# Patient Record
Sex: Female | Born: 1950 | Race: Black or African American | Hispanic: No | Smoking: Never smoker
Health system: Southern US, Community
[De-identification: ages and names within clinical notes are randomized; demographics above are authoritative.]

## PROBLEM LIST (undated history)

## (undated) DIAGNOSIS — I10 Essential (primary) hypertension: Secondary | ICD-10-CM

## (undated) DIAGNOSIS — G35 Multiple sclerosis: Secondary | ICD-10-CM

## (undated) DIAGNOSIS — N63 Unspecified lump in unspecified breast: Secondary | ICD-10-CM

## (undated) DIAGNOSIS — H539 Unspecified visual disturbance: Secondary | ICD-10-CM

## (undated) HISTORY — PX: KNEE SURGERY: SHX244

## (undated) HISTORY — DX: Unspecified visual disturbance: H53.9

## (undated) HISTORY — PX: PARTIAL HYSTERECTOMY: SHX80

---

## 1998-09-14 ENCOUNTER — Encounter: Payer: Self-pay | Admitting: Orthopedic Surgery

## 1998-09-15 ENCOUNTER — Ambulatory Visit (HOSPITAL_COMMUNITY): Admission: RE | Admit: 1998-09-15 | Discharge: 1998-09-15 | Payer: Self-pay | Admitting: Orthopedic Surgery

## 1998-10-04 ENCOUNTER — Encounter: Admission: RE | Admit: 1998-10-04 | Discharge: 1998-10-22 | Payer: Self-pay | Admitting: Orthopedic Surgery

## 1999-10-25 ENCOUNTER — Encounter: Admission: RE | Admit: 1999-10-25 | Discharge: 1999-11-23 | Payer: Self-pay | Admitting: Family Medicine

## 1999-11-17 ENCOUNTER — Encounter: Admission: RE | Admit: 1999-11-17 | Discharge: 1999-11-17 | Payer: Self-pay | Admitting: Family Medicine

## 1999-11-17 ENCOUNTER — Encounter: Payer: Self-pay | Admitting: Family Medicine

## 1999-11-28 ENCOUNTER — Encounter: Admission: RE | Admit: 1999-11-28 | Discharge: 1999-11-28 | Payer: Self-pay | Admitting: Family Medicine

## 1999-11-28 ENCOUNTER — Encounter: Payer: Self-pay | Admitting: Family Medicine

## 1999-12-13 ENCOUNTER — Other Ambulatory Visit: Admission: RE | Admit: 1999-12-13 | Discharge: 1999-12-13 | Payer: Self-pay | Admitting: Family Medicine

## 2000-03-22 ENCOUNTER — Ambulatory Visit (HOSPITAL_BASED_OUTPATIENT_CLINIC_OR_DEPARTMENT_OTHER): Admission: RE | Admit: 2000-03-22 | Discharge: 2000-03-22 | Payer: Self-pay | Admitting: Family Medicine

## 2000-06-28 ENCOUNTER — Encounter: Payer: Self-pay | Admitting: Family Medicine

## 2000-06-28 ENCOUNTER — Encounter: Admission: RE | Admit: 2000-06-28 | Discharge: 2000-06-28 | Payer: Self-pay | Admitting: Family Medicine

## 2001-01-22 ENCOUNTER — Encounter: Payer: Self-pay | Admitting: Family Medicine

## 2001-01-22 ENCOUNTER — Encounter: Admission: RE | Admit: 2001-01-22 | Discharge: 2001-01-22 | Payer: Self-pay | Admitting: Family Medicine

## 2001-02-28 ENCOUNTER — Encounter: Payer: Self-pay | Admitting: Family Medicine

## 2001-02-28 ENCOUNTER — Encounter: Admission: RE | Admit: 2001-02-28 | Discharge: 2001-02-28 | Payer: Self-pay | Admitting: Family Medicine

## 2001-03-02 ENCOUNTER — Inpatient Hospital Stay (HOSPITAL_COMMUNITY): Admission: EM | Admit: 2001-03-02 | Discharge: 2001-03-06 | Payer: Self-pay | Admitting: Emergency Medicine

## 2001-03-06 ENCOUNTER — Encounter: Payer: Self-pay | Admitting: Family Medicine

## 2001-03-13 ENCOUNTER — Encounter: Payer: Self-pay | Admitting: Family Medicine

## 2001-03-13 ENCOUNTER — Encounter: Admission: RE | Admit: 2001-03-13 | Discharge: 2001-03-13 | Payer: Self-pay | Admitting: Family Medicine

## 2001-05-10 ENCOUNTER — Encounter: Payer: Self-pay | Admitting: Neurology

## 2001-05-10 ENCOUNTER — Ambulatory Visit (HOSPITAL_COMMUNITY): Admission: RE | Admit: 2001-05-10 | Discharge: 2001-05-10 | Payer: Self-pay | Admitting: Neurology

## 2001-07-17 DIAGNOSIS — G35 Multiple sclerosis: Secondary | ICD-10-CM

## 2001-07-17 HISTORY — DX: Multiple sclerosis: G35

## 2003-02-18 ENCOUNTER — Encounter: Payer: Self-pay | Admitting: Family Medicine

## 2003-02-18 ENCOUNTER — Encounter: Admission: RE | Admit: 2003-02-18 | Discharge: 2003-02-18 | Payer: Self-pay | Admitting: Family Medicine

## 2007-06-06 ENCOUNTER — Encounter: Payer: Self-pay | Admitting: Obstetrics & Gynecology

## 2007-06-06 ENCOUNTER — Ambulatory Visit (HOSPITAL_COMMUNITY): Admission: RE | Admit: 2007-06-06 | Discharge: 2007-06-06 | Payer: Self-pay | Admitting: Family Medicine

## 2007-06-06 ENCOUNTER — Ambulatory Visit: Payer: Self-pay | Admitting: Obstetrics & Gynecology

## 2008-03-06 ENCOUNTER — Encounter: Admission: RE | Admit: 2008-03-06 | Discharge: 2008-06-04 | Payer: Self-pay | Admitting: Family Medicine

## 2010-08-26 ENCOUNTER — Other Ambulatory Visit: Payer: Self-pay | Admitting: Family Medicine

## 2010-08-26 DIAGNOSIS — Z1231 Encounter for screening mammogram for malignant neoplasm of breast: Secondary | ICD-10-CM

## 2010-09-01 ENCOUNTER — Ambulatory Visit
Admission: RE | Admit: 2010-09-01 | Discharge: 2010-09-01 | Disposition: A | Discharge status: Home / Self Care | Payer: 59 | Source: Ambulatory Visit | Attending: Family Medicine | Admitting: Family Medicine

## 2010-09-01 DIAGNOSIS — Z1231 Encounter for screening mammogram for malignant neoplasm of breast: Secondary | ICD-10-CM

## 2010-09-14 ENCOUNTER — Encounter: Payer: Self-pay | Admitting: Vascular Surgery

## 2010-09-28 ENCOUNTER — Encounter (INDEPENDENT_AMBULATORY_CARE_PROVIDER_SITE_OTHER): Payer: Medicare Other

## 2010-09-28 ENCOUNTER — Encounter (INDEPENDENT_AMBULATORY_CARE_PROVIDER_SITE_OTHER): Payer: Medicare Other | Admitting: Vascular Surgery

## 2010-09-28 ENCOUNTER — Encounter: Payer: Self-pay | Admitting: Vascular Surgery

## 2010-09-28 DIAGNOSIS — M7989 Other specified soft tissue disorders: Secondary | ICD-10-CM

## 2010-09-28 DIAGNOSIS — R609 Edema, unspecified: Secondary | ICD-10-CM
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
## 2010-10-05 NOTE — Procedures (Unsigned)
DUPLEX DEEP VENOUS EXAM - LOWER EXTREMITY  INDICATION:  Edema.  HISTORY:  Edema:  Yes. Trauma/Surgery:  No. Pain:  No. PE:  No. Previous DVT:  No. Anticoagulants:  No. Other:  DUPLEX EXAM:               CFV   SFV   PopV  PTV    GSV               R  L  R  L  R  L  R   L  R  L Thrombosis    o  o     o     o      o     o Spontaneous   +  +     +     +      +     + Phasic        +  +     +     +      +     + Augmentation  +  +     +     +      +     + Compressible  +  +     +     +      +     + Competent     +  +     +     +      +     +  Legend:  + - yes  o - no  p - partial  D - decreased   IMPRESSION: 1. No evidence of acute deep venous thrombosis or superficial     thrombophlebitis within the left lower extremity. 2. Edema visualized within the left lower extremity. 3. Pulsatile flow visualized within the left lower extremity,     suggestive of congestive heart failure.        _____________________________ Di Kindle. Edilia Bo, M.D.  OD/MEDQ  D:  09/28/2010  T:  09/28/2010  Job:  [id number]
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
## 2010-12-02 NOTE — Discharge Summary (Signed)
[facility]  Patient:    Danielle Mccarthy, Danielle Mccarthy Visit Number: [id number] MRN: [id number]          Service Type: MED Location: [phone] Attending Physician:  Beverely Low Dictated by:   Geraldo Pitter, M.D. Admit Date:  03/02/2001 Discharge Date: 03/06/2001                             Discharge Summary  HOSPITAL COURSE:  Patient is a 60 year old female who was admitted because of the inability to walk, question whether she had an upper neuron motor dysfunction.  She was seen by neurology and this was with Dr. Orlin Hilding who wanted to review her films initially and try to determine what her problem was.  She has had a progressive spastic quadriplegic paralysis and Dr. Orlin Hilding is unsure of the etiology.  Numerous tests were ordered which were all negative.  She did have a CAT scan done and an MRI done.  The MRI of the spinal cord showed no evidence of cord compression with a questionable small areas of altered signal intensity within the right aspect of the thoracic spinal cord at the T8 through 9 and T9 through 10 level.  Patient had physical therapy consultation and started walking with a walker in the hospital.  She slowly started to make improvement to the point that she could take a walker up a walk.  Initially, she was unable to go to the bathroom.  She was unable to do anything.  But, slowly, she is able to walk to the bathroom.  She does need help in coming back sometimes, but it is felt that she is certainly making improvement.  We will continue her on physical therapy on an outpatient basis.  She has had testing done and, at this point, we really do not know why she is having the problem, so we will discharge her home from the hospital in a somewhat improved state with the diagnosis of possible upper motor neuron dysfunction.  She will be sent home with physical therapy on an outpatient basis.  We will continue her Diovan, blood pressure  medications, and she will be seen in the office in a one to two week period of time. Dictated by:   Geraldo Pitter, M.D. Attending Physician:  Beverely Low DD:  04/11/01 TD:  04/11/01 Job: [id number] [id number]
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
## 2011-06-26 ENCOUNTER — Emergency Department (HOSPITAL_COMMUNITY): Payer: Medicare Other

## 2011-06-26 ENCOUNTER — Encounter (HOSPITAL_COMMUNITY): Payer: Self-pay | Admitting: Emergency Medicine

## 2011-06-26 ENCOUNTER — Other Ambulatory Visit: Payer: Self-pay

## 2011-06-26 ENCOUNTER — Inpatient Hospital Stay (HOSPITAL_COMMUNITY)
Admission: EM | Admit: 2011-06-26 | Discharge: 2011-07-01 | DRG: 602 | Disposition: A | Discharge status: Home / Self Care | Payer: Medicare Other | Attending: Family Medicine | Admitting: Family Medicine

## 2011-06-26 DIAGNOSIS — Z79899 Other long term (current) drug therapy: Secondary | ICD-10-CM

## 2011-06-26 DIAGNOSIS — M5030 Other cervical disc degeneration, unspecified cervical region: Secondary | ICD-10-CM | POA: Diagnosis present

## 2011-06-26 DIAGNOSIS — H9190 Unspecified hearing loss, unspecified ear: Secondary | ICD-10-CM | POA: Diagnosis present

## 2011-06-26 DIAGNOSIS — L03119 Cellulitis of unspecified part of limb: Principal | ICD-10-CM | POA: Diagnosis present

## 2011-06-26 DIAGNOSIS — J111 Influenza due to unidentified influenza virus with other respiratory manifestations: Secondary | ICD-10-CM | POA: Diagnosis present

## 2011-06-26 DIAGNOSIS — D72810 Lymphocytopenia: Secondary | ICD-10-CM

## 2011-06-26 DIAGNOSIS — L02419 Cutaneous abscess of limb, unspecified: Principal | ICD-10-CM | POA: Diagnosis present

## 2011-06-26 DIAGNOSIS — R609 Edema, unspecified: Secondary | ICD-10-CM | POA: Diagnosis present

## 2011-06-26 DIAGNOSIS — R600 Localized edema: Secondary | ICD-10-CM | POA: Diagnosis present

## 2011-06-26 DIAGNOSIS — J101 Influenza due to other identified influenza virus with other respiratory manifestations: Secondary | ICD-10-CM | POA: Diagnosis present

## 2011-06-26 DIAGNOSIS — I10 Essential (primary) hypertension: Secondary | ICD-10-CM | POA: Diagnosis present

## 2011-06-26 DIAGNOSIS — J189 Pneumonia, unspecified organism: Secondary | ICD-10-CM | POA: Diagnosis present

## 2011-06-26 DIAGNOSIS — R531 Weakness: Secondary | ICD-10-CM

## 2011-06-26 DIAGNOSIS — G35 Multiple sclerosis: Secondary | ICD-10-CM | POA: Diagnosis present

## 2011-06-26 DIAGNOSIS — R509 Fever, unspecified: Secondary | ICD-10-CM

## 2011-06-26 HISTORY — DX: Multiple sclerosis: G35

## 2011-06-26 HISTORY — DX: Essential (primary) hypertension: I10

## 2011-06-26 LAB — CBC
HCT: 45.2 % (ref 36.0–46.0)
MCV: 82 fL (ref 78.0–100.0)
RDW: 13.9 % (ref 11.5–15.5)
WBC: 4.2 10*3/uL (ref 4.0–10.5)

## 2011-06-26 LAB — DIFFERENTIAL
Basophils Absolute: 0 10*3/uL (ref 0.0–0.1)
Eosinophils Relative: 0 % (ref 0–5)
Lymphocytes Relative: 18 % (ref 12–46)
Lymphs Abs: 0.8 10*3/uL (ref 0.7–4.0)
Monocytes Absolute: 1.1 10*3/uL — ABNORMAL HIGH (ref 0.1–1.0)

## 2011-06-26 LAB — URINALYSIS, ROUTINE W REFLEX MICROSCOPIC
Bilirubin Urine: NEGATIVE
Glucose, UA: NEGATIVE mg/dL
Ketones, ur: NEGATIVE mg/dL
pH: 6.5 (ref 5.0–8.0)

## 2011-06-26 LAB — URINE MICROSCOPIC-ADD ON

## 2011-06-26 LAB — BASIC METABOLIC PANEL
CO2: 22 mEq/L (ref 19–32)
Chloride: 102 mEq/L (ref 96–112)
Creatinine, Ser: 0.68 mg/dL (ref 0.50–1.10)

## 2011-06-26 MED ORDER — SODIUM CHLORIDE 0.9 % IV SOLN
INTRAVENOUS | Status: DC
  Administered 2011-06-26: 1000 mL via INTRAVENOUS
  Administered 2011-06-27 – 2011-06-28 (×2): via INTRAVENOUS

## 2011-06-26 MED ORDER — ACETAMINOPHEN 325 MG PO TABS
650.0000 mg | ORAL_TABLET | Freq: Once | ORAL | Status: AC
  Administered 2011-06-26: 650 mg via ORAL
  Filled 2011-06-26: qty 2

## 2011-06-26 MED ORDER — ACETAMINOPHEN 325 MG PO TABS
ORAL_TABLET | ORAL | Status: AC
  Filled 2011-06-26: qty 1

## 2011-06-26 NOTE — ED Notes (Signed)
[id number]<AV> Expected date:<BR> Expected time:<BR> Means of arrival:<BR> Comments:<BR> Danielle Mccarthy possible pe, chest pain sob,

## 2011-06-26 NOTE — ED Notes (Signed)
Per EMS pt has had cold s/s with fever and cough x 3 weeks, pt has MS and has been seen, with no relief from medications.

## 2011-06-26 NOTE — ED Provider Notes (Signed)
History     CSN: [id number] Arrival date & time: 06/26/2011  5:37 PM   First MD Initiated Contact with Patient 06/26/11 1823      Chief Complaint  Patient presents with  . Cough  . Fever    (Consider location/radiation/quality/duration/timing/severity/associated sxs/prior treatment) Patient is a 60 y.o. female presenting with cough and fever. The history is provided by the patient.  Cough Associated symptoms include chills. Pertinent negatives include no chest pain, no headaches, no shortness of breath and no eye redness.  Fever Primary symptoms of the febrile illness include fever, cough and nausea. Primary symptoms do not include headaches, shortness of breath, abdominal pain, vomiting, diarrhea, dysuria or rash.   the patient is a 60 year old, female, with a mess and hypertension, who presents to the emergency department complaining of a cough since November 1 she says it improved and then became worse again, approximately a week ago.  She states she has yellow sputum.  She has had nausea, with no vomiting.  She says that her joints ache, as well.  However, she denies chest pain, or abdominal pain.  She has not had diarrhea.  She has had a fever and chills.  She denies diaphoresis.  She denies smoking.  COPD or history of CHF.  Past Medical History  Diagnosis Date  . Multiple sclerosis   . Hypertension     History reviewed. No pertinent past surgical history.  History reviewed. No pertinent family history.  History  Substance Use Topics  . Smoking status: Not on file  . Smokeless tobacco: Not on file  . Alcohol Use: No    OB History    Grav Para Term Preterm Abortions TAB SAB Ect Mult Living                  Review of Systems  Constitutional: Positive for fever and chills. Negative for diaphoresis.  HENT: Negative for congestion.   Eyes: Negative for redness.  Respiratory: Positive for cough. Negative for apnea and shortness of breath.   Cardiovascular: Negative  for chest pain.  Gastrointestinal: Positive for nausea. Negative for vomiting, abdominal pain and diarrhea.  Genitourinary: Negative for dysuria.  Skin: Negative for rash.  Neurological: Negative for headaches.  Psychiatric/Behavioral: Negative for confusion.  All other systems reviewed and are negative.    Allergies  Review of patient's allergies indicates no known allergies.  Home Medications   Current Outpatient Rx  Name Route Sig Dispense Refill  . BENZONATATE 100 MG PO CAPS Oral Take 200 mg by mouth 3 (three) times daily. Course started 05/29/11 for 10 day supply     . CARVEDILOL 25 MG PO TABS Oral Take 25 mg by mouth daily.      Marland Kitchen SIMVASTATIN 40 MG PO TABS Oral Take 40 mg by mouth at bedtime.      . SPIRONOLACTONE 25 MG PO TABS Oral Take 25 mg by mouth 2 (two) times daily.      Marland Kitchen TIZANIDINE HCL 4 MG PO TABS Oral Take 4 mg by mouth every 8 (eight) hours.        BP 148/87  Pulse 85  Temp(Src) 99.2 F (37.3 C) (Oral)  Resp 16  SpO2 100%  Physical Exam  Vitals reviewed. Constitutional: She is oriented to person, place, and time. She appears well-developed and well-nourished.  HENT:  Head: Normocephalic and atraumatic.  Eyes: Pupils are equal, round, and reactive to light.  Neck: Normal range of motion.  Cardiovascular: Normal rate, regular rhythm and  normal heart sounds.   No murmur heard. Pulmonary/Chest: Effort normal and breath sounds normal. No respiratory distress. She has no wheezes. She has no rales.  Abdominal: Soft. She exhibits no distension and no mass. There is no tenderness. There is no rebound and no guarding.  Musculoskeletal: She exhibits no edema and no tenderness.  Neurological: She is alert and oriented to person, place, and time. No cranial nerve deficit.  Skin: Skin is warm and dry. No rash noted. No erythema.  Psychiatric: She has a normal mood and affect. Her behavior is normal.    ED Course  Procedures (including critical care  time) 60 year old female complains of fever, and cough for several weeks.  She is not hypoxic, and she is in no distress.  We will perform a chest x-ray, and laboratory testing, looking for pneumonia and treat according to findings.  Labs Reviewed - No data to display No results found.   10:31 PM Her caretaker has arrived and says now.  The patient is too weak to stand usually she is able to walk with a walker, but now.  She is unable to.  She is not taking any steroids for her multiple sclerosis.  I will consult neurology, as well as medicine for admission.   11:52 PM I spoke with Dr. Pearlean Brownie.  He said that he or his colleagues will consult on her tomorrow.  He did not want me to start steroids.  I spoke with Dr. Lavera Guise.  He agreed to admit the patient to the hospital for an MS exacerbation.  MDM  Multiple sclerosis Weakness Fever        Nicholes Stairs, MD 06/26/11 2352

## 2011-06-26 NOTE — ED Notes (Signed)
Pt's son reports that pt has advanced MS, developed a cough three weeks ago, was prescribed cough medicine which helped until yesterday. Son reports that pt started having a runny nose, drainage, coughing, and a fever. Pt's oral temp 99.31F in triage. Pt alert x 3, talks very softly. Reports that she has had MS since 2002.

## 2011-06-26 NOTE — ED Notes (Signed)
hospitalist at the bedside 

## 2011-06-26 NOTE — ED Notes (Signed)
Pt.EKG was done, it was normal EKG

## 2011-06-26 NOTE — ED Notes (Signed)
MD at bedside. 

## 2011-06-27 ENCOUNTER — Emergency Department (HOSPITAL_COMMUNITY): Payer: Medicare Other

## 2011-06-27 ENCOUNTER — Encounter (HOSPITAL_COMMUNITY): Payer: Self-pay | Admitting: Internal Medicine

## 2011-06-27 DIAGNOSIS — L02419 Cutaneous abscess of limb, unspecified: Secondary | ICD-10-CM | POA: Diagnosis present

## 2011-06-27 DIAGNOSIS — R600 Localized edema: Secondary | ICD-10-CM | POA: Diagnosis present

## 2011-06-27 LAB — BASIC METABOLIC PANEL
CO2: 25 mEq/L (ref 19–32)
Chloride: 110 mEq/L (ref 96–112)
Creatinine, Ser: 0.69 mg/dL (ref 0.50–1.10)

## 2011-06-27 LAB — CBC
HCT: 36.2 % (ref 36.0–46.0)
Hemoglobin: 11.6 g/dL — ABNORMAL LOW (ref 12.0–15.0)
MCV: 80.8 fL (ref 78.0–100.0)
RDW: 14 % (ref 11.5–15.5)
WBC: 2.2 10*3/uL — ABNORMAL LOW (ref 4.0–10.5)

## 2011-06-27 MED ORDER — ALUM & MAG HYDROXIDE-SIMETH 200-200-20 MG/5ML PO SUSP: 30.0000 mL | Freq: Four times a day (QID) | ORAL | Status: DC | PRN

## 2011-06-27 MED ORDER — CARVEDILOL 25 MG PO TABS
25.0000 mg | ORAL_TABLET | Freq: Every day | ORAL | Status: DC
  Administered 2011-06-27 – 2011-06-30 (×4): 25 mg via ORAL
  Filled 2011-06-27 (×4): qty 1

## 2011-06-27 MED ORDER — SENNOSIDES-DOCUSATE SODIUM 8.6-50 MG PO TABS
1.0000 | ORAL_TABLET | Freq: Every evening | ORAL | Status: DC | PRN
  Filled 2011-06-27: qty 1

## 2011-06-27 MED ORDER — ACETAMINOPHEN 325 MG PO TABS: 650.0000 mg | ORAL_TABLET | Freq: Four times a day (QID) | ORAL | Status: DC | PRN

## 2011-06-27 MED ORDER — ONDANSETRON HCL 4 MG PO TABS: 4.0000 mg | ORAL_TABLET | Freq: Four times a day (QID) | ORAL | Status: DC | PRN

## 2011-06-27 MED ORDER — GADOBENATE DIMEGLUMINE 529 MG/ML IV SOLN
20.0000 mL | Freq: Once | INTRAVENOUS | Status: AC | PRN
  Administered 2011-06-27: 17 mL via INTRAVENOUS

## 2011-06-27 MED ORDER — SIMVASTATIN 40 MG PO TABS
40.0000 mg | ORAL_TABLET | Freq: Every day | ORAL | Status: DC
  Administered 2011-06-27 – 2011-06-30 (×4): 40 mg via ORAL
  Filled 2011-06-27 (×6): qty 1

## 2011-06-27 MED ORDER — TIZANIDINE HCL 4 MG PO TABS
4.0000 mg | ORAL_TABLET | Freq: Four times a day (QID) | ORAL | Status: DC | PRN
  Filled 2011-06-27: qty 1

## 2011-06-27 MED ORDER — MOXIFLOXACIN HCL IN NACL 400 MG/250ML IV SOLN
400.0000 mg | INTRAVENOUS | Status: DC
  Administered 2011-06-26 – 2011-06-28 (×2): 400 mg via INTRAVENOUS
  Filled 2011-06-27 (×2): qty 250

## 2011-06-27 MED ORDER — ALBUTEROL SULFATE (5 MG/ML) 0.5% IN NEBU: 2.5000 mg | INHALATION_SOLUTION | RESPIRATORY_TRACT | Status: DC | PRN

## 2011-06-27 MED ORDER — ENOXAPARIN SODIUM 40 MG/0.4ML ~~LOC~~ SOLN
40.0000 mg | Freq: Every day | SUBCUTANEOUS | Status: DC
  Administered 2011-06-27 – 2011-06-30 (×5): 40 mg via SUBCUTANEOUS
  Filled 2011-06-27 (×7): qty 0.4

## 2011-06-27 MED ORDER — OSELTAMIVIR PHOSPHATE 75 MG PO CAPS
75.0000 mg | ORAL_CAPSULE | Freq: Two times a day (BID) | ORAL | Status: DC
  Administered 2011-06-27 – 2011-07-01 (×8): 75 mg via ORAL
  Filled 2011-06-27 (×12): qty 1

## 2011-06-27 MED ORDER — VANCOMYCIN HCL IN DEXTROSE 1-5 GM/200ML-% IV SOLN
1000.0000 mg | Freq: Two times a day (BID) | INTRAVENOUS | Status: DC
  Administered 2011-06-27 – 2011-06-28 (×3): 1000 mg via INTRAVENOUS
  Filled 2011-06-27 (×8): qty 200

## 2011-06-27 MED ORDER — ACETAMINOPHEN 650 MG RE SUPP: 650.0000 mg | Freq: Four times a day (QID) | RECTAL | Status: DC | PRN

## 2011-06-27 MED ORDER — INFLUENZA VIRUS VACC SPLIT PF IM SUSP: 0.5000 mL | INTRAMUSCULAR | Status: DC | PRN

## 2011-06-27 MED ORDER — ONDANSETRON HCL 4 MG/2ML IJ SOLN: 4.0000 mg | Freq: Four times a day (QID) | INTRAMUSCULAR | Status: DC | PRN

## 2011-06-27 MED ORDER — GUAIFENESIN ER 600 MG PO TB12
600.0000 mg | ORAL_TABLET | Freq: Two times a day (BID) | ORAL | Status: DC
  Administered 2011-06-27 – 2011-07-01 (×9): 600 mg via ORAL
  Filled 2011-06-27 (×12): qty 1

## 2011-06-27 NOTE — ED Notes (Signed)
Patient transported to MRI 

## 2011-06-27 NOTE — ED Notes (Signed)
Pt will be in MRI for an hour and a half.

## 2011-06-27 NOTE — ED Notes (Signed)
Unable to draw AM labs, lab notified for the need for a phlebotomist to draw labs.
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
## 2011-06-27 NOTE — ED Notes (Signed)
Assisted pt eating her breakfast tray.  Swallows well, but has very limited use of left hand/arm

## 2011-06-27 NOTE — Progress Notes (Signed)
ANTIBIOTIC CONSULT NOTE - INITIAL  Pharmacy Consult for Vancomycin Indication: LLE  Cellulits/abscess/Atypical PNA  No Known Allergies  Patient Measurements: Height: 5\' 2"  (157.5 cm) Weight: 180 lb (81.647 kg) IBW/kg (Calculated) : 50.1   Vital Signs: Temp: 98.8 F (37.1 C) (12/11 0042) Temp src: Oral (12/11 0042) BP: 122/70 mmHg (12/11 0042) Pulse Rate: 78  (12/11 0042) Intake/Output from previous day:   Intake/Output from this shift:    Labs:  [facility] 06/26/11 1956  WBC 4.2  HGB 14.6  PLT 169  LABCREA --  CREATININE 0.68   Estimated Creatinine Clearance: 73.1 ml/min (by C-G formula based on Cr of 0.68). No results found for this basename: VANCOTROUGH:2,VANCOPEAK:2,VANCORANDOM:2,GENTTROUGH:2,GENTPEAK:2,GENTRANDOM:2,TOBRATROUGH:2,TOBRAPEAK:2,TOBRARND:2,AMIKACINPEAK:2,AMIKACINTROU:2,AMIKACIN:2, in the last 72 hours   Microbiology: No results found for this or any previous visit (from the past 720 hour(s)).  Medical History: Past Medical History  Diagnosis Date  . Multiple sclerosis   . Hypertension     Medications:  Scheduled:    . acetaminophen      . acetaminophen  650 mg Oral Once  . enoxaparin  40 mg Subcutaneous QHS  . moxifloxacin  400 mg Intravenous Q24H  . simvastatin  40 mg Oral QHS   Infusions:    . sodium chloride 1,000 mL (06/26/11 1934)   Assessment: 60 yo female with history of MS admitted with cellulits of LLE/abscess and possible atypical PNA.  Goal of Therapy:  Vancomycin trough level 15-20 mcg/ml  Plan:  Vancomycin 1Gm IV q12h. F/U SCr/levels as needed.  Danielle Mccarthy R 06/27/2011,1:04 AM

## 2011-06-27 NOTE — Progress Notes (Addendum)
Subjective:  Feels better overnight. Cough is less, no shortness of breath or chest pain. Objective: Vital signs in last 24 hours: Temp:  [98.8 F (37.1 C)-101 F (38.3 C)] 98.8 F (37.1 C) (12/11 0344) Pulse Rate:  [78-85] 78  (12/11 0344) Resp:  [16-27] 21  (12/11 0344) BP: (122-152)/(70-87) 152/81 mmHg (12/11 0344) SpO2:  [100 %] 100 % (12/11 0344) Weight:  [81.647 kg (180 lb)] 180 lb (81.647 kg) (12/11 0100) Weight change:     Intake/Output from previous day:       Physical Exam: General: Comfortable, alert, communicative, fully oriented, not short of breath at rest, very mildly dysarthric. HEENT:  No clinical pallor, no jaundice, no conjunctival injection or discharge. Buccal mucosa seems "dry". NECK:  Supple, JVP not seen, no carotid bruits, no palpable lymphadenopathy, no palpable goiter. CHEST:  Clinically clear to auscultation, no wheezes, no crackles. HEART:  Sounds 1 and 2 heard, normal, regular, no murmurs. ABDOMEN:  Full, soft, non-tender, no palpable organomegaly, no palpable masses, normal bowel sounds. GENITALIA:  Not examined. LOWER EXTREMITIES:  No pitting edema, although skins is a bit wrinkled, suggestive of resolved recent swelling. There is an 6cm x 8cm erythematous area, anterior to left lower leg, above the ankle, without fluctuance. Palpable peripheral pulses. MUSCULOSKELETAL SYSTEM:  Generalized osteoarthritic changes, otherwise, normal. CENTRAL NERVOUS SYSTEM:  No focal neurologic deficit on gross examination. Has RUE monoparesis.  Lab Results:  [facility] 06/27/11 0625 06/26/11 1956  WBC 2.2* 4.2  HGB 11.6* 14.6  HCT 36.2 45.2  PLT 173 169    Basename 06/27/11 0625 06/26/11 1956  NA 143 137  K 3.3* 3.9  CL 110 102  CO2 25 22  GLUCOSE 87 84  BUN 5* 7  CREATININE 0.69 0.68  CALCIUM 8.1* 9.0   No results found for this or any previous visit (from the past 240 hour(s)).   Studies/Results: Dg Pneumonia Chest 1v  06/26/2011  *RADIOLOGY  REPORT*  Clinical Data: Shortness of breath, question pneumonia  CHEST - 1 VIEW  Comparison: Portable exam 1940 hours without priors for comparison.  Findings: Slight rotation to the left. Upper normal heart size. Normal mediastinal contours and pulmonary vascularity. Azygos fissure noted. No gross infiltrate or effusion. No pneumothorax. Bones unremarkable.  IMPRESSION: No definite acute abnormalities.  Original Report Authenticated By: Lollie Marrow, M.D.    Medications: Scheduled Meds:   . acetaminophen      . acetaminophen  650 mg Oral Once  . enoxaparin  40 mg Subcutaneous QHS  . moxifloxacin  400 mg Intravenous Q24H  . simvastatin  40 mg Oral QHS  . vancomycin  1,000 mg Intravenous Q12H   Continuous Infusions:   . sodium chloride 1,000 mL (06/26/11 1934)   PRN Meds:.acetaminophen, acetaminophen, albuterol, alum & mag hydroxide-simeth, ondansetron (ZOFRAN) IV, ondansetron, senna-docusate, tiZANidine  Assessment/Plan:  Principal Problem:  *Cellulitis and abscess of leg: patient is on day# 2 Vancomycin, and already has clinical improvement. Will continue for now. For completeness, we shall do blood cultures. Active Problems:  1. Multiple sclerosis: This appears stable. Patient has RUE weakness of some standing, usually is able to feed herself with Lt arm. Ambulant with walker. For PT/OT.  2. Hypertension: Sub-optimally controlled. We shall restart pre-admission Coreg.  3. Acute bronchitis: Evidenced by productive cough with yellow phlegm,pyrexia and clear CXR. I concur with Avelox, now da# 2. Will add Mucinex to Rx. Due to epidemiologic considerations, will do Flu test.  4. Edema of lower extremity: Only minimal,  at this time.  Comment: Will continue maintenance ivi fluids, for now, at 75 cc/hr.  LOS: 1 day   Lavanna Rog,CHRISTOPHER 06/27/2011, 8:25 AM  Addendum 6:11 PM  Influenza PCR is positive for Influenza A. Have placed on droplet isolation and started 5 day course of Tamiflu.

## 2011-06-27 NOTE — Consult Note (Signed)
Reason for Consult: weakness   CC: weakness and possible MS exacerbation  HPI: Danielle Mccarthy is an 60 y.o. female  has a past medical history of Multiple sclerosis and Hypertension. Patient is followed at [facility] for her MS. Her son who is at bedside stated she was on Betaseron and most recently Rebif  for 8 years however due to progression of MS and minimal benefits from medication she was taken off.  At present time she is only on muscle relaxer (tazanadine).  Over the past few weeks she has noted increased cough and flu like symptoms.  At base line she has difficulty raising her arms greater than 45 degrees and she walks with a walker.  Due to notable weakness and flu symptoms she was brought to the ED.  At current time she has a nonproductive cough, nonfebrile, WBC 2.2, K+  3.3, Ca+ 8.1.  Past Medical History  Diagnosis Date  . Multiple sclerosis   . Hypertension     History reviewed. No pertinent past surgical history.  Family History  Problem Relation Age of Onset  . Hypertension Brother     Social History:  reports that she has never smoked. She has never used smokeless tobacco. She reports that she does not drink alcohol or use illicit drugs.  No Known Allergies  Medications:  Prior to Admission:  (Not in a hospital admission) Scheduled:   . acetaminophen      . acetaminophen  650 mg Oral Once  . carvedilol  25 mg Oral Daily  . enoxaparin  40 mg Subcutaneous QHS  . guaiFENesin  600 mg Oral BID  . moxifloxacin  400 mg Intravenous Q24H  . simvastatin  40 mg Oral QHS  . vancomycin  1,000 mg Intravenous Q12H    Review of Systems - General ROS: negative for - chills, fatigue, fever or hot flashes Hematological and Lymphatic ROS: negative for - bruising, fatigue, jaundice or pallor Endocrine ROS: negative for - hair pattern changes, hot flashes, mood swings or skin changes Respiratory ROS: negative for - cough, hemoptysis, orthopnea or wheezing Cardiovascular ROS:  negative for - dyspnea on exertion, orthopnea, palpitations or shortness of breath Gastrointestinal ROS: negative for - abdominal pain, appetite loss, blood in stools, diarrhea or hematemesis Musculoskeletal ROS: negative for - joint pain, joint stiffness, joint swelling or muscle pain Neurological ROS: as noted above in HPI Dermatological ROS: negative for dry skin, pruritus and rash   Blood pressure 161/73, pulse 77, temperature 98.8 F (37.1 C), temperature source Oral, resp. rate 22, height 5\' 2"  (1.575 m), weight 81.647 kg (180 lb), SpO2 100.00%.   Neurologic Examination: Mental Status: Alert, oriented, thought content appropriate.  Speech fluent without evidence of aphasia. Able to follow 3 step commands without difficulty. Cranial Nerves: II- Visual fields grossly intact. III/IV/VI-Extraocular movements intact.  Pupils reactive bilaterally. V/VII-Smile symmetric VIII-grossly intact IX/X-normal gag XI-bilateral shoulder shrug XII-midline tongue extension Motor: she is able to FF her arms about 30 degrees but unable to abduct her arms.  Both arms are held in flexion with increased tone.  I am able to break the tone and extend her out fully.  She shows weaker right arm than left (baseline).  Fingers are slightly flexed and shows stronger grip on the left.  She is unable to lift her legs off the bed or bend her knees. She slightly DF and PF her feet again L>R.  She has significant tone bilateral legs and I have a difficult time breaking the tone.  Sensory: Pinprick and light touch intact throughout, bilaterally Deep Tendon Reflexes: 2+ and symmetric throughout UE and 1+ bil. Patella and no ankle.   Plantars: up going bilaterally Cerebellar: Normal finger-to-nose, normal rapid alternating movements and normal heel-to-shin test.  Normal gait and station.     No results found for this basename: cbc, bmp, coags, chol, tri, ldl, hga1c    Results for orders placed during the hospital  encounter of 06/26/11 (from the past 48 hour(s))  BASIC METABOLIC PANEL     Status: Normal   Collection Time   06/26/11  7:56 PM      Component Value Range Comment   Sodium 137  135 - 145 (mEq/L)    Potassium 3.9  3.5 - 5.1 (mEq/L)    Chloride 102  96 - 112 (mEq/L)    CO2 22  19 - 32 (mEq/L)    Glucose, Bld 84  70 - 99 (mg/dL)    BUN 7  6 - 23 (mg/dL)    Creatinine, Ser 9.56  0.50 - 1.10 (mg/dL)    Calcium 9.0  8.4 - 10.5 (mg/dL)    GFR calc non Af Amer >90  >90 (mL/min)    GFR calc Af Amer >90  >90 (mL/min)   CBC     Status: Abnormal   Collection Time   06/26/11  7:56 PM      Component Value Range Comment   WBC 4.2  4.0 - 10.5 (K/uL)    RBC 5.51 (*) 3.87 - 5.11 (MIL/uL)    Hemoglobin 14.6  12.0 - 15.0 (g/dL)    HCT 21.3  08.6 - 57.8 (%)    MCV 82.0  78.0 - 100.0 (fL)    MCH 26.5  26.0 - 34.0 (pg)    MCHC 32.3  30.0 - 36.0 (g/dL)    RDW 46.9  62.9 - 52.8 (%)    Platelets 169  150 - 400 (K/uL)   DIFFERENTIAL     Status: Abnormal   Collection Time   06/26/11  7:56 PM      Component Value Range Comment   Neutrophils Relative 56  43 - 77 (%)    Neutro Abs 2.4  1.7 - 7.7 (K/uL)    Lymphocytes Relative 18  12 - 46 (%)    Lymphs Abs 0.8  0.7 - 4.0 (K/uL)    Monocytes Relative 26 (*) 3 - 12 (%)    Monocytes Absolute 1.1 (*) 0.1 - 1.0 (K/uL)    Eosinophils Relative 0  0 - 5 (%)    Eosinophils Absolute 0.0  0.0 - 0.7 (K/uL)    Basophils Relative 1  0 - 1 (%)    Basophils Absolute 0.0  0.0 - 0.1 (K/uL)   URINALYSIS, ROUTINE W REFLEX MICROSCOPIC     Status: Abnormal   Collection Time   06/26/11  9:45 PM      Component Value Range Comment   Color, Urine YELLOW  YELLOW     APPearance CLEAR  CLEAR     Specific Gravity, Urine 1.007  1.005 - 1.030     pH 6.5  5.0 - 8.0     Glucose, UA NEGATIVE  NEGATIVE (mg/dL)    Hgb urine dipstick LARGE (*) NEGATIVE     Bilirubin Urine NEGATIVE  NEGATIVE     Ketones, ur NEGATIVE  NEGATIVE (mg/dL)    Protein, ur NEGATIVE  NEGATIVE (mg/dL)     Urobilinogen, UA 0.2  0.0 - 1.0 (mg/dL)    Nitrite  NEGATIVE  NEGATIVE     Leukocytes, UA NEGATIVE  NEGATIVE    URINE MICROSCOPIC-ADD ON     Status: Normal   Collection Time   06/26/11  9:45 PM      Component Value Range Comment   Squamous Epithelial / LPF RARE  RARE     WBC, UA 0-2  <3 (WBC/hpf)    RBC / HPF 3-6  <3 (RBC/hpf)    Bacteria, UA RARE  RARE    BASIC METABOLIC PANEL     Status: Abnormal   Collection Time   06/27/11  6:25 AM      Component Value Range Comment   Sodium 143  135 - 145 (mEq/L)    Potassium 3.3 (*) 3.5 - 5.1 (mEq/L)    Chloride 110  96 - 112 (mEq/L)    CO2 25  19 - 32 (mEq/L)    Glucose, Bld 87  70 - 99 (mg/dL)    BUN 5 (*) 6 - 23 (mg/dL)    Creatinine, Ser 4.09  0.50 - 1.10 (mg/dL)    Calcium 8.1 (*) 8.4 - 10.5 (mg/dL)    GFR calc non Af Amer >90  >90 (mL/min)    GFR calc Af Amer >90  >90 (mL/min)   CBC     Status: Abnormal   Collection Time   06/27/11  6:25 AM      Component Value Range Comment   WBC 2.2 (*) 4.0 - 10.5 (K/uL)    RBC 4.48  3.87 - 5.11 (MIL/uL)    Hemoglobin 11.6 (*) 12.0 - 15.0 (g/dL)    HCT 81.1  91.4 - 78.2 (%)    MCV 80.8  78.0 - 100.0 (fL)    MCH 25.9 (*) 26.0 - 34.0 (pg)    MCHC 32.0  30.0 - 36.0 (g/dL)    RDW 95.6  21.3 - 08.6 (%)    Platelets 173  150 - 400 (K/uL)     Dg Pneumonia Chest 1v  06/26/2011  *RADIOLOGY REPORT*  Clinical Data: Shortness of breath, question pneumonia  CHEST - 1 VIEW  Comparison: Portable exam 1940 hours without priors for comparison.  Findings: Slight rotation to the left. Upper normal heart size. Normal mediastinal contours and pulmonary vascularity. Azygos fissure noted. No gross infiltrate or effusion. No pneumothorax. Bones unremarkable.  IMPRESSION: No definite acute abnormalities.  Original Report Authenticated By: Lollie Marrow, M.D.     Assessment/Plan: 60 YO female with known MS who is seen at [facility].  She is on no current medications for her MS except muscle relaxants.  Over past 2  weeks she has had increased weakness and flu like sx.  Ddx includes viral illness, PNA or possibly MS flair.   Recommend: (1) due to WBC 2.2 and uncertain this is MS flair would not recommend steroid at this time (2) MRI brain with contrast (3) will follow   Felicie Morn PA-C [facility] [phone]  06/27/2011, 9:20 AM

## 2011-06-27 NOTE — ED Notes (Signed)
Assisted pt by feeding her lunch tray.  Pt tolerated well.  Call bell in reach. Pt denies needing anything at this time.

## 2011-06-27 NOTE — ED Notes (Signed)
Pt back from MRI  Pt alert and oriented x4. Respirations even and unlabored, bilateral symmetrical rise and fall of chest. Skin warm and dry. In no acute distress. Denies needs.   

## 2011-06-27 NOTE — ED Notes (Signed)
Lab tech at bedside for a.m labs. Resting comfortably

## 2011-06-27 NOTE — ED Notes (Signed)
Lab came to draw blood cultures but pt had just left for MRI.

## 2011-06-27 NOTE — Progress Notes (Signed)
Report rec'd from [employer] @ [phone].  Pt coming to rm [id number].
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
## 2011-06-28 DIAGNOSIS — J101 Influenza due to other identified influenza virus with other respiratory manifestations: Secondary | ICD-10-CM | POA: Diagnosis present

## 2011-06-28 LAB — CBC
HCT: 36.4 % (ref 36.0–46.0)
Hemoglobin: 11.5 g/dL — ABNORMAL LOW (ref 12.0–15.0)
MCV: 80.7 fL (ref 78.0–100.0)
RBC: 4.51 MIL/uL (ref 3.87–5.11)
WBC: 2.6 10*3/uL — ABNORMAL LOW (ref 4.0–10.5)

## 2011-06-28 LAB — BASIC METABOLIC PANEL
BUN: 8 mg/dL (ref 6–23)
CO2: 24 mEq/L (ref 19–32)
Chloride: 110 mEq/L (ref 96–112)
Creatinine, Ser: 0.63 mg/dL (ref 0.50–1.10)
Glucose, Bld: 103 mg/dL — ABNORMAL HIGH (ref 70–99)

## 2011-06-28 NOTE — Progress Notes (Addendum)
Occupational Therapy Evaluation Patient Details Name: Danielle Mccarthy MRN: [id number] DOB: 1950/10/21 Today's Date: 06/28/2011 Time in: 10:45 am Time out: 11:10 am Eval II  Problem List:  Patient Active Problem List  Diagnoses  . Multiple sclerosis  . Hypertension  . Fever  . Edema of lower extremity  . Cellulitis and abscess of leg  . Influenza A    Past Medical History:  Past Medical History  Diagnosis Date  . Multiple sclerosis   . Hypertension    Past Surgical History: History reviewed. No pertinent past surgical history.  OT Assessment/Plan/Recommendation OT Assessment Clinical Impression Statement: patient will benefit from OT services to return to prior level of function.  OT Recommendation/Assessment: Patient will need skilled OT in the acute care venue OT Problem List: Decreased strength;Decreased range of motion;Impaired tone OT Therapy Diagnosis : Generalized weakness;Hemiplegia dominant side;Paresis OT Plan OT Frequency: Min 1X/week OT Treatment/Interventions: Self-care/ADL training;Therapeutic activities;DME and/or AE instruction OT Recommendation Follow Up Recommendations: None Equipment Recommended: None recommended by OT Individuals Consulted Consulted and Agree with Results and Recommendations: Patient OT Goals Acute Rehab OT Goals OT Goal Formulation: With patient Time For Goal Achievement: 2 weeks ADL Goals Pt Will Perform Eating: with min assist;with adaptive utensils ADL Goal: Eating - Progress: Progressing toward goals Pt Will Transfer to Toilet: with mod assist;Stand pivot transfer;3-in-1;with DME ADL Goal: Toilet Transfer - Progress: Progressing toward goals  OT Evaluation Precautions/Restrictions  Precautions Precautions: Fall Precaution Comments: droplet Restrictions Weight Bearing Restrictions: No Prior Functioning Home Living Lives With: Sheran Spine Help From: Family Type of Home: House Home Layout: One level Home  Access: Ramped entrance Bathroom Shower/Tub: Walk-in shower Bathroom Accessibility: Yes How Accessible: Accessible via wheelchair Home Adaptive Equipment: Walker - rolling;Wheelchair - manual;Shower chair with back;Bedside commode/3-in-1 Prior Function Level of Independence: Needs assistance with ADLs;Needs assistance with tranfers Bath: Total Toileting: Total Dressing: Total Grooming: Maximal Feeding: Supervision/set-up ADL ADL Eating/Feeding: +1 Total assistance;Other (comment) Eating/Feeding Details (indicate cue type and reason): PTA, pt rested elbow on table and able to use elbow movement and compensate with head movement, to feed herself. With position of patient in bed and bed table further away, pt unable to reach food to scoop, etc.  Where Assessed - Eating/Feeding: Bed level Grooming: Performed;Teeth care;Wash/dry face;+1 Total assistance Grooming Details (indicate cue type and reason): pt required assist for application of toothpaste and assist to position washcloth and toothhbrush in her left hand. Pt able to use some elbow motion and head turns to brush teeth and wash face but still needed assist to reach all areas. Where Assessed - Grooming: Supine, head of bed up Upper Body Bathing: Simulated;+1 Total assistance Where Assessed - Upper Body Bathing: Supine, head of bed up Lower Body Bathing: +1 Total assistance;Simulated Lower Body Bathing Details (indicate cue type and reason): for legs. did not have patient roll for periareas simulated as she had just returned to bed with PT Where Assessed - Lower Body Bathing: Supine, head of bed up Upper Body Dressing: Simulated;+1 Total assistance Where Assessed - Upper Body Dressing: Supine, head of bed up Lower Body Dressing: Simulated;+1 Total assistance Where Assessed - Lower Body Dressing: Supine, head of bed up Toilet Transfer: Not assessed (Per PT was total assist for transfer) Toilet Transfer Method: Not assessed Toileting -  Clothing Manipulation: Not assessed Where Assessed - Toileting Clothing Manipulation: Not assessed Toileting - Hygiene: Not assessed Where Assessed - Toileting Hygiene: Not assessed Tub/Shower Transfer: Not assessed Tub/Shower Transfer Method: Not assessed ADL Comments: 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 Vision/Perception  Vision - History Baseline Vision: Wears glasses all the time Cognition Cognition Arousal/Alertness: Awake/alert Overall Cognitive Status: Appears within functional limits for tasks assessed Orientation Level: Oriented X4 Cognition - Other Comments: patient hard of hearing (even with hearing aids needs to read lips) Sensation/Coordination Sensation Light Touch: Appears Intact Extremity Assessment RUE Assessment RUE Assessment: Exceptions to [facility] RUE Strength RUE Overall Strength Comments: fingers flexed. Unable to straighten. not able to raise UE off bed. Minimal movement at wrist.  (noted tone) LUE Assessment LUE Assessment: Exceptions to [facility] LUE Strength LUE Overall Strength Comments: able to achieve approximately 30 degrees of shoulder flexion from supine level. Able to flex/extend wrist though only about 50% of range. Able to flex and extend elbow some actively but tone noted. Has some hand movement and grip though fingers not fully extended. (noted tone) Mobility  Bed Mobility Bed Mobility: Yes Supine to Sit: 2: Max assist Supine to Sit Details (indicate cue type and reason): assist for LE's over edge of bed and to lift upper body Sit to Supine - Right: 1: +2 Total assist Sit to Supine - Right Details (indicate cue type and reason): for lowering shoulders and lifting legs Scooting to HOB: 1: +2 Total assist Scooting to [facility] Details (indicate cue type and reason): to scoot up on pad Exercises Other Exercises Other Exercises: performed AAROM/PROM for UE/LE's during bathing procedures as well as tone inhibition with weight bearing and joint rotation End of  Session OT - End of Session Activity Tolerance: Patient tolerated treatment well Patient left: in bed;with call bell in reach General Behavior During Session: [facility] for tasks performed Cognition: [facility] for tasks performed   Lennox Laity [phone] 06/28/2011, 2:40 PM

## 2011-06-28 NOTE — Progress Notes (Signed)
Assessed patient for PICC placement. Patient has limited mobility both extremities. Cephalic veins too small to place PICC. Therefore a referral to IR for line placement is recommended.

## 2011-06-28 NOTE — Progress Notes (Deleted)
60 yo F with known multiple sclerosis, presents to ER with increased weakness x2 days, fever cough and a runny nose.  Upon examination she was also found to have a red hot left leg.   Started 12/11: Vanc 1g q12h/Avelox IV/ Tamiflu 75mg  BID for atypical Pna/bronchitis, +influenza A, LLE cellulitis.  AF last 24hrs, WBC same, SCr same. Blood cx ngtd.

## 2011-06-28 NOTE — Progress Notes (Signed)
UR CHART REVIEWED; B Niki Payment RN, BSN, MHA 
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
## 2011-06-28 NOTE — Consult Note (Signed)
Subjective: Patient says that she feels better today.   Objective: Vital signs in last 24 hours: Temp:  [98 F (36.7 C)-98.1 F (36.7 C)] 98 F (36.7 C) (12/12 0622) Pulse Rate:  [63-71] 63  (12/12 0622) Resp:  [16-19] 16  (12/12 0622) BP: (138-160)/(73-88) 160/88 mmHg (12/12 0622) SpO2:  [94 %-100 %] 98 % (12/12 0622)  Intake/Output from previous day: 12/11 0701 - 12/12 0700 In: 1615 [P.O.:365; I.V.:600; IV Piggyback:650] Out: 250 [Urine:250]   Nutritional status: General  Neuro: AAO*3, no aphasia, followed simple commands, appeared to be emotional/tearful at times, EOMI, VFF, no face asymmetry, tongue midline, nasal speech, V1-V3 sensation intact to pain, right hemiplegia, left hemiparesis 2/5. Spastic throughout. Fingers of hands chronically flexed. Sensation intact to pain throughout.   Lab Results:  [facility] 06/28/11 0320 06/27/11 0625  WBC 2.6* 2.2*  HGB 11.5* 11.6*  HCT 36.4 36.2  PLT 172 173  NA 143 143  K 3.2* 3.3*  CL 110 110  CO2 24 25  GLUCOSE 103* 87  BUN 8 5*  CREATININE 0.63 0.69  CALCIUM 8.1* 8.1*  LABA1C -- --   Studies/Results: Mr Lodema Pilot Contrast  06/27/2011  *RADIOLOGY REPORT*  Clinical Data:  Evaluate for MS flair. Generalized weakness. Recent influenza.  MRI HEAD WITHOUT AND WITH CONTRAST MRI CERVICAL SPINE WITHOUT AND WITH CONTRAST  Technique:  Multiplanar, multiecho pulse sequences of the brain and surrounding structures, and cervical spine, to include the craniocervical junction and cervicothoracic junction, were obtained without and with intravenous contrast.  Contrast: 17mL MULTIHANCE GADOBENATE DIMEGLUMINE 529 MG/ML IV SOLN  Comparison:  Outside report from [facility] 2011.  MRI HEAD  Findings:  No acute stroke, intracranial hemorrhage, mass lesion, hydrocephalus, or extra-axial fluid.  Multiple foci of increased signal affects the periventricular greater than subcortical white matter, involving the corpus callosum and callosal septal  interface, a characteristic distribution consistent with the clinical diagnosis of MS.   No areas of restricted diffusion are seen.  There is no enhancement postcontrast.  As best can be determined from the previous report, no new lesions are present.  Major intracranial vascular structures widely patent.  Normal orbits, and mastoids.  Chronic sinus disease affects the ethmoid, maxillary, and frontal regions.  IMPRESSION: The findings are consistent with chronic MS.  By report there are no apparent interval changes, and there are no areas of restricted effusion or abnormal post contrast enhancement.  MRI CERVICAL SPINE  Findings: There is a prominent 1 cm lesion at the cervicomedullary junction ventrally which was described on the previous report and is consistent with chronic MS.  This lesion does not enhance.  No other cervical lesions are seen.  There is chronic disc disease at C5-C6.  Marrow signal is homogeneous except for endplate reactive changes.  There is no abnormal enhancement postcontrast.  Benign meningoceles  project laterally from C6-7 and C7-T1 on the right.  IMPRESSION: Findings consistent with chronic MS.  Chronic DDD C5-C6.  Original Report Authenticated By: Elsie Stain, M.D.   Mr Cervical Spine W Wo Contrast  06/27/2011  *RADIOLOGY REPORT*  Clinical Data:  Evaluate for MS flair. Generalized weakness. Recent influenza.  MRI HEAD WITHOUT AND WITH CONTRAST MRI CERVICAL SPINE WITHOUT AND WITH CONTRAST  Technique:  Multiplanar, multiecho pulse sequences of the brain and surrounding structures, and cervical spine, to include the craniocervical junction and cervicothoracic junction, were obtained without and with intravenous contrast.  Contrast: 17mL MULTIHANCE GADOBENATE DIMEGLUMINE 529 MG/ML IV SOLN  Comparison:  Outside report from [facility] 2011.  MRI HEAD  Findings:  No acute stroke, intracranial hemorrhage, mass lesion, hydrocephalus, or extra-axial fluid.  Multiple foci of increased  signal affects the periventricular greater than subcortical white matter, involving the corpus callosum and callosal septal interface, a characteristic distribution consistent with the clinical diagnosis of MS.   No areas of restricted diffusion are seen.  There is no enhancement postcontrast.  As best can be determined from the previous report, no new lesions are present.  Major intracranial vascular structures widely patent.  Normal orbits, and mastoids.  Chronic sinus disease affects the ethmoid, maxillary, and frontal regions.  IMPRESSION: The findings are consistent with chronic MS.  By report there are no apparent interval changes, and there are no areas of restricted effusion or abnormal post contrast enhancement.  MRI CERVICAL SPINE  Findings: There is a prominent 1 cm lesion at the cervicomedullary junction ventrally which was described on the previous report and is consistent with chronic MS.  This lesion does not enhance.  No other cervical lesions are seen.  There is chronic disc disease at C5-C6.  Marrow signal is homogeneous except for endplate reactive changes.  There is no abnormal enhancement postcontrast.  Benign meningoceles  project laterally from C6-7 and C7-T1 on the right.  IMPRESSION: Findings consistent with chronic MS.  Chronic DDD C5-C6.  Original Report Authenticated By: Elsie Stain, M.D.   Dg Pneumonia Chest 1v  06/26/2011  *RADIOLOGY REPORT*  Clinical Data: Shortness of breath, question pneumonia  CHEST - 1 VIEW  Comparison: Portable exam 1940 hours without priors for comparison.  Findings: Slight rotation to the left. Upper normal heart size. Normal mediastinal contours and pulmonary vascularity. Azygos fissure noted. No gross infiltrate or effusion. No pneumothorax. Bones unremarkable.  IMPRESSION: No definite acute abnormalities.  Original Report Authenticated By: Lollie Marrow, M.D.   Medications: I have reviewed the patient's current  medications.  Assessment/Plan: 60 years old woman with progressive MS who presents with generalized fatigue and worsening of her MS symptoms 1) MRI brain and C-spine with contrast: no new lesions 2) PT 3) Manage flu symptoms 4) Psych consult for depression 5) Call with questions  LOS: 2 days   [place]

## 2011-06-28 NOTE — Progress Notes (Signed)
Physical Therapy Evaluation Patient Details Name: Danielle Mccarthy MRN: [id number] DOB: [birth year] Today's Date: 06/28/2011 [id number] EV2, 1TA  Discharge Recommendations: Patient will continue to need 24 hour assist at home and will benefit from HHPT and [facility] aide  Problem List:  Patient Active Problem List  Diagnoses  . Multiple sclerosis  . Hypertension  . Fever  . Edema of lower extremity  . Cellulitis and abscess of leg  . Influenza A    Past Medical History:  Past Medical History  Diagnosis Date  . Multiple sclerosis   . Hypertension    Past Surgical History: History reviewed. No pertinent past surgical history.  PT Assessment/Plan/Recommendation PT Assessment Clinical Impression Statement: Patient with n/o MS now with cellulitis and flu like symptoms presenting as if in exacerbation.  Will benefit from skilled PT to maximize mobility for decreased burden of care for son at home and for initiation of [facility] services. PT Recommendation/Assessment: Patient will need skilled PT in the acute care venue PT Problem List: Impaired tone;Decreased balance;Decreased mobility PT Therapy Diagnosis : Generalized weakness;Abnormality of gait PT Plan PT Frequency: Min 3X/week PT Treatment/Interventions: Functional mobility training;Therapeutic activities;Therapeutic exercise;Balance training;Wheelchair mobility training;Patient/family education PT Recommendation Follow Up Recommendations: Home health PT Equipment Recommended: None recommended by PT PT Goals  Acute Rehab PT Goals PT Goal Formulation: With patient Time For Goal Achievement: 2 weeks Pt will go Supine/Side to Sit: with mod assist;with rail PT Goal: Supine/Side to Sit - Progress: Progressing toward goal Pt will go Sit to Stand: with mod assist PT Goal: Sit to Stand - Progress: Progressing toward goal Pt will Transfer Bed to Chair/Chair to Bed: with mod assist PT Transfer Goal: Bed to Chair/Chair to Bed - Progress:  Progressing toward goal Pt will Stand: with supervision;with unilateral upper extremity support;with bilateral upper extremity support;3 - 5 min (for improved safety during ADL's) PT Goal: Stand - Progress: Progressing toward goal  PT Evaluation Precautions/Restrictions  Precautions Precautions: Fall Precaution Comments: droplet Restrictions Weight Bearing Restrictions: No Prior Functioning  Home Living Lives With: Sheran Spine Help From: Family Type of Home: House Home Layout: One level Home Access: Ramped entrance Bathroom Shower/Tub: Walk-in shower Bathroom Accessibility: Yes How Accessible: Accessible via wheelchair Home Adaptive Equipment: Walker - rolling;Wheelchair - manual;Shower chair with back;Bedside commode/3-in-1 Prior Function Level of Independence: Needs assistance with ADLs;Needs assistance with tranfers Cognition Cognition Arousal/Alertness: Awake/alert Overall Cognitive Status: Appears within functional limits for tasks assessed Orientation Level: Oriented X4 Cognition - Other Comments: patient hard of hearing (even with hearing aids needs to read lips) Sensation/Coordination Sensation Light Touch: Appears Intact Extremity AssessmentRLE Assessment RLE Assessment: Exceptions to [facility] RLE AROM (degrees) Overall AROM Right Lower Extremity: Deficits (due to excessive muscle tone) RLE Tone RLE Tone: Modified Ashworth Modified Ashworth Scale for Grading Hypertonia RLE: Considerable increase in muschle tone, passive movement difficult LLE Assessment LLE Assessment: Exceptions to WFL LLE AROM (degrees) Overall AROM Left Lower Extremity: Deficits (due to excessive muscle tone) LLE Tone LLE Tone: Modified Ashworth Modified Ashworth Scale for Grading Hypertonia LLE: Considerable increase in muschle tone, passive movement difficult Mobility (including Balance) Bed Mobility Bed Mobility: Yes Supine to Sit: 2: Max assist Supine to Sit Details (indicate cue type  and reason): assist for LE's over edge of bed and to lift upper body Sit to Supine - Right: 1: +2 Total assist Sit to Supine - Right Details (indicate cue type and reason): for lowering shoulders and lifting legs Scooting to HOB: 1: +2 Total assist Scooting to [facility]  Details (indicate cue type and reason): to scoot up on pad Transfers Transfers: Yes Stand Pivot Transfers: 2: Max assist Stand Pivot Transfer Details (indicate cue type and reason): bed<>BSC (to [facility] with no walker and lift and pivot, then sit to stand to walker to allow for hygiene with +1 total assist pt=20%, then removed walker to pivot patient back to bed Ambulation/Gait Ambulation/Gait: No  Static Sitting Balance Static Sitting - Balance Support: No upper extremity supported;Feet supported Static Sitting - Level of Assistance: 5: Stand by assistance Static Sitting - Comment/# of Minutes: after placed upright, patient able to sit with supervision  Static Standing Balance Static Standing - Balance Support: Bilateral upper extremity supported Static Standing - Level of Assistance: 3: Mod assist;4: Min assist Static Standing - Comment/# of Minutes: stood for approx 1 1/2 min for perineal hygiene with right UE on walker vs both UE's placed on walker and fluctuating level of assist dependent on COG over BOS Exercise  Other Exercises Other Exercises: performed AAROM/PROM for UE/LE's during bathing procedures as well as tone inhibition with weight bearing and joint rotation End of Session PT - End of Session Equipment Utilized During Treatment: Gait belt Activity Tolerance: Patient tolerated treatment well Patient left: in bed;with call bell in reach Nurse Communication: Mobility status for transfers General Behavior During Session: [facility] for tasks performed Cognition: [facility] for tasks performed  [facility] 06/28/2011, 2:20 PM

## 2011-06-28 NOTE — Progress Notes (Signed)
[id number]/[id number] Davis,RN,BSN,CCM:  [facility] called for hhc consult for home health aide.

## 2011-06-28 NOTE — Progress Notes (Signed)
[facility] H[facility]PITAL[facility] progress note    Interval h/o:-  60 yr old AAF admit 12/11 with Cellulitis and possible Atypical PNA, found to be PCR + for H Flu Type A.  Seen by Neuro, MRA/I done which shows DDD c5-c6 mainly and chronic changes otherwise. Vancomycin (12.11>>>) Moxifloxacin (12.10>>>06/28/11) Tamiflu (06/27/11>>>12.16.12)  Subjective: Sleepy.  Hard of hearing.  Feels a little better today.   Still has productive cough.  No f/chills  Treatment Team:  Neuro1 [facility] Objective: Vital signs in last 24 hours: Temp:  [98 F (36.7 C)-98.1 F (36.7 C)] 98 F (36.7 C) (12/12 0622) Pulse Rate:  [63-71] 63  (12/12 0622) Resp:  [16-19] 16  (12/12 0622) BP: (138-160)/(73-88) 160/88 mmHg (12/12 0622) SpO2:  [94 %-100 %] 98 % (12/12 0622) Weight change:   Intake/Output Summary (Last 24 hours) at 06/28/11 0900 Last data filed at 06/27/11 2300  Gross per 24 hour  Intake   1615 ml  Output    250 ml  Net   1365 ml    BP 160/88  Pulse 63  Temp(Src) 98 F (36.7 C) (Oral)  Resp 16  Ht 5\' 1"  (1.549 m)  Wt 81.647 kg (180 lb)  BMI 34.01 kg/m2  SpO2 98% General appearance: alert, cooperative and appears older than stated age Head: Normocephalic, without obvious abnormality, atraumatic Throat: lips, mucosa, and tongue normal; teeth and gums normal Lungs: clear to auscultation bilaterally and normal percussion bilaterally Heart: regular rate and rhythm, S1, S2 normal, no murmur, click, rub or gallop Neurologic: Grossly normal  Lab Results:  Basename 06/28/11 0320 06/27/11 0625  NA 143 143  K 3.2* 3.3*  CL 110 110  CO2 24 25  GLUCOSE 103* 87  BUN 8 5*  CREATININE 0.63 0.69  CALCIUM 8.1* 8.1*  MG -- --  PHOS -- --   No results found for this basename: AST:2,ALT:2,ALKPHOS:2,BILITOT:2,PROT:2,ALBUMIN:2 in the last 72 hours No results found for this basename: LIPASE:2,AMYLASE:2 in the last 72 hours  Basename 06/28/11 0320 06/27/11 0625 06/26/11 1956  WBC 2.6* 2.2* --    NEUTROABS -- -- 2.4  HGB 11.5* 11.6* --  HCT 36.4 36.2 --  MCV 80.7 80.8 --  PLT 172 173 --     Medications: I have reviewed the patient's current medications. Scheduled Meds:   . carvedilol  25 mg Oral Daily  . enoxaparin  40 mg Subcutaneous QHS  . guaiFENesin  600 mg Oral BID  . moxifloxacin  400 mg Intravenous Q24H  . oseltamivir  75 mg Oral BID  . simvastatin  40 mg Oral QHS  . vancomycin  1,000 mg Intravenous Q12H   Continuous Infusions:   . sodium chloride 75 mL/hr at 06/28/11 0737   PRN Meds:.acetaminophen, acetaminophen, albuterol, alum & mag hydroxide-simeth, gadobenate dimeglumine, influenza  inactive virus vaccine, ondansetron (ZOFRAN) IV, ondansetron, senna-docusate, tiZANidine   Assessment/Plan: *Cellulitis and abscess of leg: patient is on day#3 Vancomycin Will continue for now. Blood cultures are pending-Will narrow to Oral Clnda/Bactrim in am Active Problems:  1. Multiple sclerosis: This appears stable. Patient has RUE weakness of some standing, usually is able to feed herself with Lt arm. Ambulant with walker. For PT/OT. Neurology has seen and thinks her issues are chronic and have signed off-MRA/I done 12/11 was essentially unchnged.  2. Hypertension: Sub-optimally controlled. We shall restart pre-admission Coreg-average control on Coreg 25 qd.  3. Acute bronchitis: Evidenced by productive cough with yellow phlegm,pyrexia and clear CXR.Avelox,recieved days 2-d/c 06/28/11 Influenza PCR is positive for Influenza  A.  Have placed on droplet isolation and started 5 day course of Tamiflu  4. Edema of lower extremity: Only minimal, at this time-will KVO fluids .     LOS: 2 days   Maximum Reiland,JAI 06/28/2011, 9:00 AM

## 2011-06-29 DIAGNOSIS — D72810 Lymphocytopenia: Secondary | ICD-10-CM

## 2011-06-29 LAB — CBC
MCH: 24.6 pg — ABNORMAL LOW (ref 26.0–34.0)
MCHC: 30.7 g/dL (ref 30.0–36.0)
MCV: 80.1 fL (ref 78.0–100.0)
Platelets: 180 10*3/uL (ref 150–400)
RBC: 4.72 MIL/uL (ref 3.87–5.11)

## 2011-06-29 LAB — BASIC METABOLIC PANEL
CO2: 26 mEq/L (ref 19–32)
Calcium: 8.5 mg/dL (ref 8.4–10.5)
Creatinine, Ser: 0.64 mg/dL (ref 0.50–1.10)
GFR calc non Af Amer: >90 mL/min (ref >90)
Glucose, Bld: 83 mg/dL (ref 70–99)

## 2011-06-29 MED ORDER — SULFAMETHOXAZOLE-TMP DS 800-160 MG PO TABS
1.0000 | ORAL_TABLET | Freq: Two times a day (BID) | ORAL | Status: DC
  Administered 2011-06-29 – 2011-07-01 (×5): 1 via ORAL
  Filled 2011-06-29 (×8): qty 1

## 2011-06-29 NOTE — Progress Notes (Signed)
[facility] progress note    60 yr old AAF admit 12/11 with Cellulitis and possible Atypical PNA, found to be PCR + for H Flu Type A. Seen by Neuro, MRA/I done which shows DDD c5-c6 mainly and chronic changes otherwise.  Vancomycin (12.11>>>12.12)  Moxifloxacin (12.10>>>06/28/11)  Tamiflu (06/27/11>>>12.16.12)             Bactrim (12.13>>>12.20)     Subjective: Feels fair.  No new issues.  Leg still appears swollen.  Note IV access not possible by Bedside IR team.  Denies any f/chills/n/v/cpsob.  Treatment Team:  Neuro1 [facility] Objective: Vital signs in last 24 hours: Temp:  [97.6 F (36.4 C)-98.7 F (37.1 C)] 98.7 F (37.1 C) (12/13 0555) Pulse Rate:  [56-66] 63  (12/13 0555) Resp:  [15-18] 18  (12/13 0555) BP: (144-164)/(82-90) 144/86 mmHg (12/13 0555) SpO2:  [98 %-99 %] 99 % (12/13 0555) Weight change:   Intake/Output Summary (Last 24 hours) at 06/29/11 1054 Last data filed at 06/28/11 2300  Gross per 24 hour  Intake    245 ml  Output    200 ml  Net     45 ml   General appearance: alert, cooperative and appears older than stated age  Head: Normocephalic, without obvious abnormality, atraumatic  Throat: lips, mucosa, and tongue normal; teeth and gums normal  Lungs: clear to auscultation bilaterally and normal percussion bilaterally  Heart: regular rate and rhythm, S1, S2 normal, no murmur, click, rub or gallop  Neurologic: Grossly normal Skin-Has LLE swelling and darkening of skin, but not warm, not tender  Lab Results:  [facility] 06/29/11 0340 06/28/11 0320  NA 140 143  K 3.4* 3.2*  CL 106 110  CO2 26 24  GLUCOSE 83 103*  BUN 9 8  CREATININE 0.64 0.63  CALCIUM 8.5 8.1*  MG -- --  PHOS -- --   No results found for this basename: AST:2,ALT:2,ALKPHOS:2,BILITOT:2,PROT:2,ALBUMIN:2 in the last 72 hours No results found for this basename: LIPASE:2,AMYLASE:2 in the last 72 hours  Basename 06/29/11 0340 06/28/11 0320 06/26/11 1956  WBC 2.9* 2.6*  --  NEUTROABS -- -- 2.4  HGB 11.6* 11.5* --  HCT 37.8 36.4 --  MCV 80.1 80.7 --  PLT 180 172 --    Micro Results: Recent Results (from the past 240 hour(s))  CULTURE, BLOOD (ROUTINE X 2)     Status: Normal (Preliminary result)   Collection Time   06/27/11 12:35 PM      Component Value Range Status Comment   Specimen Description BLOOD LEFT ARM   Final    Special Requests BOTTLES DRAWN AEROBIC AND ANAEROBIC 5CC   Final    Setup Time [id number]   Final    Culture     Final    Value:        BLOOD CULTURE RECEIVED NO GROWTH TO DATE CULTURE WILL BE HELD FOR 5 DAYS BEFORE ISSUING A FINAL NEGATIVE REPORT   Report Status PENDING   Incomplete   CULTURE, BLOOD (ROUTINE X 2)     Status: Normal (Preliminary result)   Collection Time   06/27/11 12:45 PM      Component Value Range Status Comment   Specimen Description BLOOD LEFT HAND   Final    Special Requests BOTTLES DRAWN AEROBIC AND ANAEROBIC 5CC   Final    Setup Time [id number]   Final    Culture     Final    Value:        BLOOD CULTURE RECEIVED  NO GROWTH TO DATE CULTURE WILL BE HELD FOR 5 DAYS BEFORE ISSUING A FINAL NEGATIVE REPORT   Report Status PENDING   Incomplete     Medications: I have reviewed the patient's current medications. Scheduled Meds:   . carvedilol  25 mg Oral Daily  . enoxaparin  40 mg Subcutaneous QHS  . guaiFENesin  600 mg Oral BID  . oseltamivir  75 mg Oral BID  . simvastatin  40 mg Oral QHS  . vancomycin  1,000 mg Intravenous Q12H   Continuous Infusions:  PRN Meds:.acetaminophen, acetaminophen, albuterol, alum & mag hydroxide-simeth, influenza  inactive virus vaccine, ondansetron (ZOFRAN) IV, ondansetron, senna-docusate, tiZANidine   Assessment/Plan: Cellulitis and abscess of leg: patient received day#3 Vancomycin  Blood cultures 12.10 are neg so far narrowed to Oral Bactrim in 12.13>>12.20  Active Problems:  1. Multiple sclerosis: This appears stable. Patient has RUE weakness of some standing,  usually is able to feed herself with Lt arm. Ambulant with walker. PT suggest [facility] RN/PT+ aide  Neurology has seen and thinks her issues are chronic and have signed off-MRA/I done 12/11 was essentially unchanged.   2. Hypertension: Moderate controlled Coreg 25 qd.   3. Acute bronchitis: Evidenced by productive cough with yellow phlegm,pyrexia and clear CXR. Avelox,recieved days 2-d/c 06/28/11  Influenza PCR is positive for Influenza A.  Have placed on droplet isolation and started 5 day course of Tamiflu   4. Edema of lower extremity: Only minimal, at this time-will KVO fluids.  Son states that this is also a chronic issue, but that this got worse over the course of te past couple of days and coincided with the Flu like illness that she had  5. Lymphopenia-likely 2/2 to Viral Flu illness.  Expect to resolve.  Baseline WBC about 4-5   LOS: 3 days   Edgar Corrigan,JAI 06/29/2011, 10:54 AM

## 2011-06-29 NOTE — Progress Notes (Signed)
MD on call notified that pt does not have IV acess and that the IV team has been trying to place PICC line, but could not after several tries. So pt is unable to get IV antibiotic. Will cont to monitor her. Richmond Campbell, RN

## 2011-06-30 MED ORDER — OSELTAMIVIR PHOSPHATE 75 MG PO CAPS: 75.0000 mg | ORAL_CAPSULE | Freq: Two times a day (BID) | ORAL | Status: AC

## 2011-06-30 MED ORDER — CARVEDILOL 25 MG PO TABS
25.0000 mg | ORAL_TABLET | Freq: Two times a day (BID) | ORAL | Status: DC
  Administered 2011-06-30 – 2011-07-01 (×3): 25 mg via ORAL
  Filled 2011-06-30 (×6): qty 1

## 2011-06-30 MED ORDER — CARVEDILOL 25 MG PO TABS: 25.0000 mg | ORAL_TABLET | Freq: Two times a day (BID) | ORAL | Status: DC

## 2011-06-30 MED ORDER — INFLUENZA VIRUS VACC SPLIT PF IM SUSP
0.5000 mL | INTRAMUSCULAR | Status: AC | PRN
  Administered 2011-06-30: 0.5 mL via INTRAMUSCULAR
  Filled 2011-06-30: qty 0.5

## 2011-06-30 MED ORDER — SULFAMETHOXAZOLE-TMP DS 800-160 MG PO TABS: 1.0000 | ORAL_TABLET | Freq: Two times a day (BID) | ORAL | Status: AC

## 2011-06-30 NOTE — Plan of Care (Signed)
Problem: Phase I Progression Outcomes Goal: Progress activity as tolerated unless otherwise ordered Outcome: Completed/Met Date Met:  06/30/11 Worked with PT/OT 06/28/11

## 2011-06-30 NOTE — Discharge Summary (Addendum)
Physician Discharge Summary  Patient ID: Orra Nolde MRN: [id number] DOB/AGE: 11-26-1950 60 y.o.  Admit date: 06/26/2011 Discharge date: 06/30/2011  Admission Diagnoses: Weakness, Lower extremity Cellulitis  Discharge Diagnoses:  Principal Problem:  *Cellulitis and abscess of leg Active Problems:  Multiple sclerosis  Hypertension  Fever  Edema of lower extremity  Influenza A  Lymphopenia   Discharged Condition: good  Hospital Course: 60 yr old AAF admit 12/11 with Cellulitis and possible Atypical PNA, found to be PCR + for H Flu Type A.  She did receive a Flu shot prior to d/c home.  Seen by Neuro, MRA/I done which shows DDD c5-c6 mainly and chronic changes otherwise.  Progressed well over hospitalization and antibiotics for Lower extremities narrowed to Bactrim to complete full course of therapy Neurology saw patient and deemed no further work-up and out-pt follow up if needed Abx during hospitalization as below  Vancomycin (12.11>>>12.12)  Moxifloxacin (12.10>>>06/28/11)  Tamiflu (06/27/11>>>12.16.12)  Bactrim (12.13>>>12.19)Consults: neurology  Significant Diagnostic Studies: labs: CBC showed a leucopenia, likely related to her being H FLu + and radiology: MRI brain and neck showed chronic changes of MS and DDD as per above    Treatments: IV hydration and antibiotics: ceftriaxone and Moxi>> Bactrim  Discharge Exam: Blood pressure 153/84, pulse 61, temperature 98.6 F (37 C), temperature source Oral, resp. rate 16, height 5\' 1"  (1.549 m), weight 81.647 kg (180 lb), SpO2 99.00%. General appearance: alert, cooperative and appears stated age Head: Normocephalic, without obvious abnormality, atraumatic Eyes: conjunctivae/corneas clear. PERRL, EOM's intact. Fundi benign. Throat: lips, mucosa, and tongue normal; teeth and gums normal Resp: clear to auscultation bilaterally and normal percussion bilaterally Chest wall: no tenderness GI: soft, non-tender; bowel  sounds normal; no masses,  no organomegaly Extremities: extremities normal, atraumatic, no cyanosis or edema, venous stasis dermatitis noted and swelling in RLE noted within and around areas marked out and delineated and appears unchanged  Pulses: 2+ and symmetric Skin: see above  Disposition:   Discharge Orders    Future Orders Please Complete By Expires   Diet - low sodium heart healthy      Increase activity slowly      Discharge instructions      Comments:   Review wound with MD   Call MD for:      Call MD for:  temperature >100.4      Call MD for:  redness, tenderness, or signs of infection (pain, swelling, redness, odor or green/yellow discharge around incision site)      Call MD for:  severe uncontrolled pain      Call MD for:  difficulty breathing, headache or visual disturbances        Current Discharge Medication List    START taking these medications   Details  oseltamivir (TAMIFLU) 75 MG capsule Take 1 capsule (75 mg total) by mouth 2 (two) times daily. Qty: 4 capsule, Refills: 0    sulfamethoxazole-trimethoprim (BACTRIM DS) 800-160 MG per tablet Take 1 tablet by mouth every 12 (twelve) hours. Qty: 14 tablet, Refills: 0      CONTINUE these medications which have CHANGED   Details  carvedilol (COREG) 25 MG tablet Take 1 tablet (25 mg total) by mouth 2 (two) times daily. Qty: 60 tablet, Refills: 0      CONTINUE these medications which have NOT CHANGED   Details  benzonatate (TESSALON) 100 MG capsule Take 200 mg by mouth 3 (three) times daily. Course started 05/29/11 for 10 day supply     simvastatin (  ZOCOR) 40 MG tablet Take 40 mg by mouth at bedtime.      spironolactone (ALDACTONE) 25 MG tablet Take 25 mg by mouth 2 (two) times daily.      tiZANidine (ZANAFLEX) 4 MG tablet Take 4 mg by mouth every 8 (eight) hours.         Follow-up Information    Follow up with REESE,BETTI D .         Needs follow up for LE swelling and  MS  Signed: Ahman Dugdale,JAI 06/30/2011, 10:08 AM    Pt not d/c on above date Felt a little woozy and lioghtheaded with nausea  today, feels better, no cop/n/v/sob/blurred vision Seems dependant on other to eat and drink, but ambulant to RR this am without difficulty  Filed Vitals:   07/01/11 0555  BP: 139/80  Pulse: 61  Temp: 98.4 F (36.9 C)  Resp: 18   EOMI CTA B S1 S2 no m/r/g CN 2-12  Plan-per above  9:20 AM Erisa Mehlman,JAI

## 2011-06-30 NOTE — Progress Notes (Signed)
Physical Therapy Treatment Patient Details Name: Danielle Mccarthy MRN: [id number] DOB: June 15, 1951 Today's Date: 06/30/2011 [id number] 3TA  PT Assessment/Plan  PT - Assessment/Plan Comments on Treatment Session: Patient with planned d/c for today to home with son's assist and HHPT/aide. PT Plan: Discharge plan remains appropriate PT Frequency: Min 3X/week Follow Up Recommendations: Home health PT;24 hour supervision/assistance PT Goals  Acute Rehab PT Goals PT Goal: Supine/Side to Sit - Progress: Progressing toward goal PT Goal: Sit to Stand - Progress: Progressing toward goal PT Transfer Goal: Bed to Chair/Chair to Bed - Progress: Progressing toward goal PT Goal: Stand - Progress: Progressing toward goal  PT Treatment Precautions/Restrictions  Precautions Precautions: Fall Precaution Comments: droplet Restrictions Weight Bearing Restrictions: No Mobility (including Balance) Bed Mobility Supine to Sit: 2: Max assist Supine to Sit Details (indicate cue type and reason): for LE's over bed and to lift upper body Transfers Transfers: Yes Stand Pivot Transfers: 4: Min assist;3: Mod assist Stand Pivot Transfer Details (indicate cue type and reason): with rolling walker and pt stepping to chair min to mod assist with increased time.  bed to [facility] mod/max assist with stand pivot without RW.  Static Standing Balance Static Standing - Balance Support: Bilateral upper extremity supported Static Standing - Level of Assistance: 4: Min assist Static Standing - Comment/# of Minutes: standing with walker after hands placed on walker with minguard assist Exercise  General Exercises - Lower Extremity Heel Slides: AAROM;Both;10 reps;Supine (with increased time due to increased tone) Hip ABduction/ADduction: AAROM;Both;10 reps;Supine (with increased time due to increased tone) End of Session PT - End of Session Equipment Utilized During Treatment: Gait belt (pt's shoes) Activity Tolerance:  Patient tolerated treatment well Patient left: in chair;with call bell in reach General Behavior During Session: [facility] for tasks performed Cognition: [facility] for tasks performed  [facility] 06/30/2011, 4:54 PM

## 2011-07-03 LAB — CULTURE, BLOOD (ROUTINE X 2)
Culture  Setup Time: 201212112326
Culture: NO GROWTH

## 2011-09-25 ENCOUNTER — Other Ambulatory Visit: Payer: Self-pay | Admitting: Family Medicine

## 2011-09-25 DIAGNOSIS — Z1231 Encounter for screening mammogram for malignant neoplasm of breast: Secondary | ICD-10-CM

## 2011-10-24 ENCOUNTER — Ambulatory Visit
Admission: RE | Admit: 2011-10-24 | Discharge: 2011-10-24 | Disposition: A | Discharge status: Home / Self Care | Payer: Medicare Other | Source: Ambulatory Visit | Attending: Family Medicine | Admitting: Family Medicine

## 2011-10-24 DIAGNOSIS — Z1231 Encounter for screening mammogram for malignant neoplasm of breast: Secondary | ICD-10-CM

## 2011-10-25 ENCOUNTER — Other Ambulatory Visit: Payer: Self-pay | Admitting: Family Medicine

## 2011-10-25 DIAGNOSIS — R928 Other abnormal and inconclusive findings on diagnostic imaging of breast: Secondary | ICD-10-CM

## 2011-11-02 ENCOUNTER — Ambulatory Visit
Admission: RE | Admit: 2011-11-02 | Discharge: 2011-11-02 | Disposition: A | Discharge status: Home / Self Care | Payer: Medicare Other | Source: Ambulatory Visit | Attending: Family Medicine | Admitting: Family Medicine

## 2011-11-02 DIAGNOSIS — R928 Other abnormal and inconclusive findings on diagnostic imaging of breast: Secondary | ICD-10-CM

## 2014-04-20 ENCOUNTER — Other Ambulatory Visit: Payer: Self-pay | Admitting: Cardiology

## 2014-04-20 DIAGNOSIS — R600 Localized edema: Secondary | ICD-10-CM

## 2014-06-08 ENCOUNTER — Other Ambulatory Visit (HOSPITAL_COMMUNITY): Payer: Self-pay | Admitting: Cardiology

## 2014-06-08 DIAGNOSIS — I10 Essential (primary) hypertension: Secondary | ICD-10-CM

## 2014-06-08 DIAGNOSIS — E8770 Fluid overload, unspecified: Secondary | ICD-10-CM

## 2014-06-10 ENCOUNTER — Ambulatory Visit (HOSPITAL_COMMUNITY)
Admission: RE | Admit: 2014-06-10 | Discharge: 2014-06-10 | Disposition: A | Discharge status: Home / Self Care | Payer: Medicare Other | Source: Ambulatory Visit | Attending: Cardiology | Admitting: Cardiology

## 2014-06-10 ENCOUNTER — Ambulatory Visit (HOSPITAL_COMMUNITY)
Admission: RE | Admit: 2014-06-10 | Discharge: 2014-06-10 | Disposition: A | Discharge status: Home / Self Care | Payer: Medicare Other | Source: Ambulatory Visit | Attending: Family Medicine | Admitting: Family Medicine

## 2014-06-10 ENCOUNTER — Other Ambulatory Visit (HOSPITAL_COMMUNITY): Payer: Self-pay | Admitting: Cardiology

## 2014-06-10 DIAGNOSIS — R609 Edema, unspecified: Secondary | ICD-10-CM

## 2014-06-10 DIAGNOSIS — E8770 Fluid overload, unspecified: Secondary | ICD-10-CM

## 2014-06-10 DIAGNOSIS — I10 Essential (primary) hypertension: Secondary | ICD-10-CM | POA: Insufficient documentation

## 2014-06-10 DIAGNOSIS — G35 Multiple sclerosis: Secondary | ICD-10-CM | POA: Insufficient documentation

## 2014-06-10 NOTE — Progress Notes (Signed)
*  Preliminary Results* Bilateral lower extremity venous duplex completed. Visualized veins of bilateral lower extremities are negative for deep vein thrombosis. There is no evidence of Baker's cyst bilaterally.  06/10/2014  Gertie FeyMichelle Chiquita Heckert, RVT, RDCS, RDMS

## 2014-06-10 NOTE — Progress Notes (Signed)
  Echocardiogram 2D Echocardiogram has been performed.  Arvil Chaco 06/10/2014, 4:18 PM

## 2015-07-26 ENCOUNTER — Encounter (HOSPITAL_COMMUNITY): Payer: Self-pay | Admitting: Emergency Medicine

## 2015-07-26 ENCOUNTER — Emergency Department (HOSPITAL_COMMUNITY)
Admission: EM | Admit: 2015-07-26 | Discharge: 2015-07-26 | Disposition: A | Discharge status: Home / Self Care | Payer: Medicare Other | Attending: Emergency Medicine | Admitting: Emergency Medicine

## 2015-07-26 ENCOUNTER — Emergency Department (HOSPITAL_COMMUNITY): Payer: Medicare Other

## 2015-07-26 DIAGNOSIS — I10 Essential (primary) hypertension: Secondary | ICD-10-CM | POA: Insufficient documentation

## 2015-07-26 DIAGNOSIS — Z79899 Other long term (current) drug therapy: Secondary | ICD-10-CM | POA: Insufficient documentation

## 2015-07-26 DIAGNOSIS — R600 Localized edema: Secondary | ICD-10-CM | POA: Diagnosis not present

## 2015-07-26 DIAGNOSIS — R0789 Other chest pain: Secondary | ICD-10-CM | POA: Insufficient documentation

## 2015-07-26 DIAGNOSIS — Z8669 Personal history of other diseases of the nervous system and sense organs: Secondary | ICD-10-CM | POA: Diagnosis not present

## 2015-07-26 DIAGNOSIS — R079 Chest pain, unspecified: Secondary | ICD-10-CM | POA: Diagnosis present

## 2015-07-26 LAB — BASIC METABOLIC PANEL
ANION GAP: 12 (ref 5–15)
BUN: 16 mg/dL (ref 6–20)
CO2: 22 mmol/L (ref 22–32)
Calcium: 8.9 mg/dL (ref 8.9–10.3)
Chloride: 106 mmol/L (ref 101–111)
Creatinine, Ser: 0.76 mg/dL (ref 0.44–1.00)
Glucose, Bld: 95 mg/dL (ref 65–99)
POTASSIUM: 5.2 mmol/L — AB (ref 3.5–5.1)
SODIUM: 140 mmol/L (ref 135–145)

## 2015-07-26 LAB — CBC WITH DIFFERENTIAL/PLATELET
Basophils Absolute: 0.1 10*3/uL (ref 0.0–0.1)
Basophils Relative: 1 %
EOS ABS: 0.1 10*3/uL (ref 0.0–0.7)
Eosinophils Relative: 2 %
HCT: 39.2 % (ref 36.0–46.0)
HEMOGLOBIN: 12.7 g/dL (ref 12.0–15.0)
LYMPHS ABS: 2.1 10*3/uL (ref 0.7–4.0)
Lymphocytes Relative: 41 %
MCH: 27.3 pg (ref 26.0–34.0)
MCHC: 32.4 g/dL (ref 30.0–36.0)
MCV: 84.1 fL (ref 78.0–100.0)
MONO ABS: 0.5 10*3/uL (ref 0.1–1.0)
Monocytes Relative: 10 %
NEUTROS PCT: 46 %
Neutro Abs: 2.2 10*3/uL (ref 1.7–7.7)
PLATELETS: 181 10*3/uL (ref 150–400)
RBC: 4.66 MIL/uL (ref 3.87–5.11)
RDW: 14.3 % (ref 11.5–15.5)
WBC: 5 10*3/uL (ref 4.0–10.5)

## 2015-07-26 LAB — I-STAT TROPONIN, ED: TROPONIN I, POC: 0 ng/mL (ref 0.00–0.08)

## 2015-07-26 NOTE — Discharge Instructions (Signed)
You were evaluated in the ED today for chest discomfort. There does not appear to be an emergent cause for your symptoms at this time. Your discomfort is likely due to chest wall pain. Your exam was reassuring as were your labs and chest x-ray. Please follow-up with your doctor in the next 2-3 days for reevaluation. Return to ED for any new or worsening symptoms as we discussed.  Chest Wall Pain Chest wall pain is pain in or around the bones and muscles of your chest. Sometimes, an injury causes this pain. Sometimes, the cause may not be known. This pain may take several weeks or longer to get better. HOME CARE INSTRUCTIONS  Pay attention to any changes in your symptoms. Take these actions to help with your pain:   Rest as told by your health care provider.   Avoid activities that cause pain. These include any activities that use your chest muscles or your abdominal and side muscles to lift heavy items.   If directed, apply ice to the painful area:  Put ice in a plastic bag.  Place a towel between your skin and the bag.  Leave the ice on for 20 minutes, 2-3 times per day.  Take over-the-counter and prescription medicines only as told by your health care provider.  Do not use tobacco products, including cigarettes, chewing tobacco, and e-cigarettes. If you need help quitting, ask your health care provider.  Keep all follow-up visits as told by your health care provider. This is important. SEEK MEDICAL CARE IF:  You have a fever.  Your chest pain becomes worse.  You have new symptoms. SEEK IMMEDIATE MEDICAL CARE IF:  You have nausea or vomiting.  You feel sweaty or light-headed.  You have a cough with phlegm (sputum) or you cough up blood.  You develop shortness of breath.   This information is not intended to replace advice given to you by your health care provider. Make sure you discuss any questions you have with your health care provider.   Document Released:  07/03/2005 Document Revised: 03/24/2015 Document Reviewed: 09/28/2014 Elsevier Interactive Patient Education [employer]

## 2015-07-26 NOTE — ED Provider Notes (Signed)
CSN: [id number]     Arrival date & time 07/26/15  1240 History   First MD Initiated Contact with Patient 07/26/15 1244     Chief Complaint  Patient presents with  . Chest Pain     (Consider location/radiation/quality/duration/timing/severity/associated sxs/prior Treatment) HPI Danielle Mccarthy is a 65 y.o. female with history of MS and hypertension and hard of hearing, comes in for evaluation of chest pain. Patient reports she has had well localized left chest soreness intermittently since September. She describes these episodes as fleeting pains in her left chest that are not exertional. She reports a total of 5 episodes. She reports most recent episode occurred at 8:00 this morning while watching TV. She denies any shortness of breath, nausea or vomiting, diaphoresis, numbness or weakness. She came in today for evaluation because her home health nurse told her she needed to be evaluated. No fevers, chills, recent illnesses, unusual leg swelling. Patient does report significant peripheral edema at baseline due to her MS. Patient does report multiple physical limitations due to her MS and is usually sedentary. Nonsmoker, no diabetes, no family history of cardiac disease.  Past Medical History  Diagnosis Date  . Multiple sclerosis (HCC)   . Hypertension    History reviewed. No pertinent past surgical history. Family History  Problem Relation Age of Onset  . Hypertension Brother    Social History  Substance Use Topics  . Smoking status: Never Smoker   . Smokeless tobacco: Never Used  . Alcohol Use: No   OB History    No data available     Review of Systems A 10 point review of systems was completed and was negative except for pertinent positives and negatives as mentioned in the history of present illness     Allergies  Review of patient's allergies indicates no known allergies.  Home Medications   Prior to Admission medications   Medication Sig Start Date End Date  Taking? Authorizing Provider  carvedilol (COREG) 25 MG tablet Take 25 mg by mouth daily.   Yes Historical Provider, MD  spironolactone (ALDACTONE) 25 MG tablet Take 25 mg by mouth 2 (two) times daily.     Yes Historical Provider, MD  carvedilol (COREG) 25 MG tablet Take 1 tablet (25 mg total) by mouth 2 (two) times daily. 06/30/11 06/29/12  Rhetta Mura, MD   BP 119/62 mmHg  Pulse 62  Temp(Src) 97.6 F (36.4 C) (Oral)  Resp 18  SpO2 100% Physical Exam  Constitutional: She is oriented to person, place, and time. She appears well-developed and well-nourished.  Overall well-appearing African-American female.  HENT:  Head: Normocephalic and atraumatic.  Mouth/Throat: Oropharynx is clear and moist.  Hearing aids  Eyes: Conjunctivae are normal. Pupils are equal, round, and reactive to light. Right eye exhibits no discharge. Left eye exhibits no discharge. No scleral icterus.  Neck: Normal range of motion. Neck supple.  Cardiovascular: Normal rate, regular rhythm and normal heart sounds.   Pulmonary/Chest: Effort normal and breath sounds normal. No respiratory distress. She has no wheezes. She has no rales.    Well localized, tenderness to palpation over left breast. No obvious deformities noted. No crepitus. No erythema, nipple drainage or other evidence of infection.  Abdominal: Soft. There is no tenderness.  Musculoskeletal: She exhibits edema. She exhibits no tenderness.  Patient has significant peripheral edema that is baseline for her.  Neurological: She is alert and oriented to person, place, and time.  Cranial Nerves II-XII grossly intact  Skin: Skin is  warm and dry. No rash noted.  Psychiatric: She has a normal mood and affect.  Nursing note and vitals reviewed.   ED Course  Procedures (including critical care time) Labs Review Labs Reviewed  BASIC METABOLIC PANEL - Abnormal; Notable for the following:    Potassium 5.2 (*)    All other components within normal limits   CBC WITH DIFFERENTIAL/PLATELET  I-STAT TROPOININ, ED  I-STAT TROPOININ, ED    Imaging Review Dg Chest Port 1 View  07/26/2015  CLINICAL DATA:  LEFT chest soreness for 2 weeks radiating to LEFT arm, history multiple sclerosis, hypertension EXAM: PORTABLE CHEST 1 VIEW COMPARISON:  Portable exam 1341 hours compared to 06/26/2011 FINDINGS: Borderline enlargement of cardiac silhouette. Mediastinal contours and pulmonary vascularity normal. Minimal bibasilar atelectasis. Lungs otherwise clear. No pleural effusion or pneumothorax. Bones demineralized. IMPRESSION: Bibasilar atelectasis. Electronically Signed   By: Ulyses Southward M.D.   On: 07/26/2015 13:49   I have personally reviewed and evaluated these images and lab results as part of my medical decision-making.   EKG Interpretation   Date/Time:  Monday July 26 2015 13:00:15 EST Ventricular Rate:  63 PR Interval:  187 QRS Duration: 96 QT Interval:  391 QTC Calculation: 400 R Axis:   26 Text Interpretation:  Sinus rhythm Low voltage, precordial leads ECG  OTHERWISE WITHIN NORMAL LIMITS Since last tracing rate slower Confirmed by  MILLER  MD, BRIAN ([id number]) on 07/26/2015 1:13:03 PM     Meds given in ED:  Medications - No data to display  New Prescriptions   No medications on file   Filed Vitals:   07/26/15 1315 07/26/15 1330 07/26/15 1415 07/26/15 1500  BP: 139/76 147/69 136/95 119/62  Pulse: 63 63 64 62  Temp:      TempSrc:      Resp: SpO2: 100% 100% 100% 100%    MDM  Danielle Mccarthy is a 65 y.o. female with history of hypertension and MS who comes in for evaluation of intermittent chest discomfort since September. No discomfort in the ED. Episodes are described as sharp, fleeting, nonexertional. Patient denies any other associated symptoms. Came in today for evaluation at the behest of her home health nurse. Most recent episode occurred at 8:00. Troponin is negative. EKG is reassuring and without any ischemic  change. Labs are grossly unremarkable. Chest x-ray without any acute cardiopulmonary pathology. Discomfort is reproduced exactly with palpation of left breast tissue. No evidence of cellulitis, crepitus or other concerning findings. Suspect musculoskeletal source of patient's discomfort. Low suspicion for ACS or PE. Patient denies any cough, unilateral leg swelling, no hypoxia, tachycardia, or other vital sign changes. Per Wells criteria patient is low risk. Encouraged follow-up with PCP for further evaluation and management of symptoms. No evidence of other acute or emergent pathology this time. Discussed results and ED course with patient as well as strict return precautions. Patient verbalizes understanding and agrees with this plan as well as subsequent discharge. Prior to patient discharge, I discussed and reviewed this case with my attending, Dr. Hyacinth Meeker, who agrees with the plan.    Final diagnoses:  Chest wall pain       Joycie Peek, PA-C 07/26/15 1536  Eber Hong, MD 07/26/15 [phone]

## 2015-07-26 NOTE — ED Provider Notes (Signed)
The patient arrives by ambulance with a complaint of left-sided chest pain which she states is fleeting, one to 2 seconds, comes on sporadically in the left upper chest and is reproducible on my exam with palpation to the left upper chest wall. She is in no distress, no shortness of breath, no tachycardia or hypoxia. She decided chronic lower extremity edema and she is very immobile essentially bedbound from her severe multiple sclerosis. EKG unremarkable, labs ordered and pending. Doubt acute coronary syndrome or pulmonary embolus  Medical screening examination/treatment/procedure(s) were conducted as a shared visit with non-physician practitioner(s) and myself.  I personally evaluated the patient during the encounter.  Clinical Impression:   Final diagnoses:  Chest wall pain      EKG Interpretation  Date/Time:  Monday July 26 2015 13:00:15 EST Ventricular Rate:  63 PR Interval:  187 QRS Duration: 96 QT Interval:  391 QTC Calculation: 400 R Axis:   26 Text Interpretation:  Sinus rhythm Low voltage, precordial leads ECG OTHERWISE WITHIN NORMAL LIMITS Since last tracing rate slower Confirmed by Hyacinth Meeker  MD, Lula Kolton ([id number]) on 07/26/2015 1:13:03 PM        Eber Hong, MD 07/26/15 [phone]

## 2015-07-26 NOTE — ED Notes (Signed)
Pt to ER from home via [facility] with complaint of left chest soreness x2 weeks with radiation into left arm, denies shortness of breath, nausea, weakness, etc. No cardiac hx. Hx of MS. Pain free at this time. VSS. A/O x4.

## 2016-04-27 ENCOUNTER — Ambulatory Visit: Payer: Medicare Other | Attending: Family Medicine | Admitting: Physical Therapy

## 2016-04-27 ENCOUNTER — Encounter: Payer: Self-pay | Admitting: Physical Therapy

## 2016-04-27 DIAGNOSIS — M6281 Muscle weakness (generalized): Secondary | ICD-10-CM

## 2016-04-27 DIAGNOSIS — R2681 Unsteadiness on feet: Secondary | ICD-10-CM | POA: Diagnosis present

## 2016-04-27 NOTE — Therapy (Signed)
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      G-Codes - 05-25-16  1241    Functional Assessment Tool Used patient is totally dependent for mobility and transfers; maintains static sitting position 30 sec with close supervision without back or UE support.    Functional Limitation Changing and maintaining body position   Changing and Maintaining Body Position Current Status [phone]) At least 80 percent but less than 100 percent impaired, limited or restricted   Changing and Maintaining Body Position Goal Status (E9937) At least 80 percent but less than 100 percent impaired, limited or restricted   Changing and Maintaining Body Position Discharge Status (J6967) At least 80 percent but less than 100 percent impaired, limited or restricted       Problem List Patient Active Problem List   Diagnosis Date Noted  . Lymphopenia 06/29/2011  . Influenza A 06/28/2011  . Edema of lower extremity 06/27/2011  . Cellulitis and abscess of leg 06/27/2011  . Fever 06/26/2011  . Multiple sclerosis ([place])   . Hypertension     Danielle Mccarthy PT, DPT 05/25/2016, 12:44 PM  [facility] [address] [place], [place], [address] Phone: [phone]   Fax:  [phone]  Name: Danielle Mccarthy MRN: [id number] Date of Birth: 1951-04-04

## 2016-08-29 ENCOUNTER — Other Ambulatory Visit: Payer: Self-pay | Admitting: Family Medicine

## 2016-08-29 DIAGNOSIS — N63 Unspecified lump in unspecified breast: Secondary | ICD-10-CM

## 2016-09-04 ENCOUNTER — Ambulatory Visit
Admission: RE | Admit: 2016-09-04 | Discharge: 2016-09-04 | Disposition: A | Discharge status: Home / Self Care | Payer: Medicare Other | Source: Ambulatory Visit | Attending: Family Medicine | Admitting: Family Medicine

## 2016-09-04 DIAGNOSIS — N63 Unspecified lump in unspecified breast: Secondary | ICD-10-CM

## 2016-09-04 HISTORY — DX: Unspecified lump in unspecified breast: N63.0

## 2017-04-08 ENCOUNTER — Encounter (HOSPITAL_COMMUNITY): Payer: Self-pay | Admitting: Emergency Medicine

## 2017-04-08 ENCOUNTER — Emergency Department (HOSPITAL_COMMUNITY): Payer: Medicare Other

## 2017-04-08 ENCOUNTER — Emergency Department (HOSPITAL_COMMUNITY)
Admission: EM | Admit: 2017-04-08 | Discharge: 2017-04-08 | Disposition: A | Discharge status: Home / Self Care | Payer: Medicare Other | Attending: Emergency Medicine | Admitting: Emergency Medicine

## 2017-04-08 DIAGNOSIS — I10 Essential (primary) hypertension: Secondary | ICD-10-CM | POA: Diagnosis not present

## 2017-04-08 DIAGNOSIS — G35 Multiple sclerosis: Secondary | ICD-10-CM | POA: Insufficient documentation

## 2017-04-08 DIAGNOSIS — R002 Palpitations: Secondary | ICD-10-CM | POA: Diagnosis not present

## 2017-04-08 LAB — CBC
HCT: 40.7 % (ref 36.0–46.0)
HEMOGLOBIN: 13.1 g/dL (ref 12.0–15.0)
MCH: 26.5 pg (ref 26.0–34.0)
MCHC: 32.2 g/dL (ref 30.0–36.0)
MCV: 82.4 fL (ref 78.0–100.0)
Platelets: 179 10*3/uL (ref 150–400)
RBC: 4.94 MIL/uL (ref 3.87–5.11)
RDW: 13.6 % (ref 11.5–15.5)
WBC: 5 10*3/uL (ref 4.0–10.5)

## 2017-04-08 LAB — BASIC METABOLIC PANEL
Anion gap: 12 (ref 5–15)
BUN: 19 mg/dL (ref 6–20)
CHLORIDE: 107 mmol/L (ref 101–111)
CO2: 18 mmol/L — AB (ref 22–32)
Calcium: 8.6 mg/dL — ABNORMAL LOW (ref 8.9–10.3)
Creatinine, Ser: 0.72 mg/dL (ref 0.44–1.00)
GFR calc Af Amer: >60 mL/min (ref >60)
GFR calc non Af Amer: >60 mL/min (ref >60)
GLUCOSE: 85 mg/dL (ref 65–99)
POTASSIUM: 4.7 mmol/L (ref 3.5–5.1)
Sodium: 137 mmol/L (ref 135–145)

## 2017-04-08 LAB — I-STAT TROPONIN, ED
Troponin i, poc: 0.01 ng/mL (ref 0.00–0.08)
Troponin i, poc: 0.02 ng/mL (ref 0.00–0.08)

## 2017-04-08 MED ORDER — ASPIRIN 81 MG PO CHEW
324.0000 mg | CHEWABLE_TABLET | Freq: Once | ORAL | Status: AC
  Administered 2017-04-08: 324 mg via ORAL
  Filled 2017-04-08: qty 4

## 2017-04-08 NOTE — ED Notes (Signed)
QNS for cbc nurse notified.

## 2017-04-08 NOTE — ED Provider Notes (Signed)
[facility] DEPT Provider Note   CSN: [id number] Arrival date & time: 04/08/17  0145     History   Chief Complaint Chief Complaint  Patient presents with  . Palpitations    HPI Danielle Mccarthy is a 66 y.o. female.  The history is provided by the patient and a relative.  Palpitations    At about 11:30 PM, patient was working on her computer and noted that her heart was beating fast and she had a pressure feeling in her chest. There is associated diaphoresis. There is no nausea, vomiting, dyspnea. Symptoms lasted about 5 minutes before resolving. Nothing made it better, nothing made it worse. Her son noted that her heart rate was fast both by looking at her neck pulsations, and checking it with a blood pressure cuff. She has had two other similar episodes over the last 4 months. She has discussed this with her PCP, but has not had any testing. Of note, she is supposed to be taking carvedilol, but has been noncompliant. She is also supposed to take spironolactone and is noncompliant with that as well.  Past Medical History:  Diagnosis Date  . Breast mass    mass left breast  . Hypertension   . Multiple sclerosis [facility])     Patient Active Problem List   Diagnosis Date Noted  . Lymphopenia 06/29/2011  . Influenza A 06/28/2011  . Edema of lower extremity 06/27/2011  . Cellulitis and abscess of leg 06/27/2011  . Fever 06/26/2011  . Multiple sclerosis (HCC)   . Hypertension     History reviewed. No pertinent surgical history.  OB History    No data available       Home Medications    Prior to Admission medications   Medication Sig Start Date End Date Taking? Authorizing Provider  carvedilol (COREG) 25 MG tablet Take 1 tablet (25 mg total) by mouth 2 (two) times daily. Patient not taking: Reported on 04/08/2017 06/30/11 04/08/17  Rhetta Mura, MD    Family History Family History  Problem Relation Age of Onset  . Hypertension Brother     Social  History Social History  Substance Use Topics  . Smoking status: Never Smoker  . Smokeless tobacco: Never Used  . Alcohol use No     Allergies   Patient has no known allergies.   Review of Systems Review of Systems  Cardiovascular: Positive for palpitations.  All other systems reviewed and are negative.    Physical Exam Updated Vital Signs BP (!) 156/70   Pulse 69   Resp (!) 22   SpO2 100%   Physical Exam  Nursing note and vitals reviewed.  66 year old female, resting comfortably and in no acute distress. Vital signs are significant for hypertension and tachypnea. Oxygen saturation is 100%, which is normal. Head is normocephalic and atraumatic. PERRLA, EOMI. Oropharynx is clear. Neck is nontender and supple without adenopathy or JVD. Back is nontender and there is no CVA tenderness. Lungs are clear without rales, wheezes, or rhonchi. Chest is nontender. Heart has regular rate and rhythm without murmur. Abdomen is soft, flat, nontender without masses or hepatosplenomegaly and peristalsis is normoactive. Extremities have no cyanosis or edema, full range of motion is present. Skin is warm and dry without rash. Neurologic: Mental status is normal, cranial nerves are intact, there are no motor or sensory deficits.  ED Treatments / Results  Labs (all labs ordered are listed, but only abnormal results are displayed) Labs Reviewed  BASIC METABOLIC PANEL -  Abnormal; Notable for the following:       Result Value   CO2 18 (*)    Calcium 8.6 (*)    All other components within normal limits  CBC  CBC  I-STAT TROPONIN, ED  I-STAT TROPONIN, ED    EKG  EKG Interpretation  Date/Time:  Sunday April 08 2017 01:56:28 EDT Ventricular Rate:  72 PR Interval:    QRS Duration: 96 QT Interval:  386 QTC Calculation: 423 R Axis:   0 Text Interpretation:  Sinus rhythm Low voltage, precordial leads When compared with ECG of 07/26/2015, No significant change was found Confirmed  by Dione Booze ([id number]) on 04/08/2017 1:59:16 AM       Radiology Dg Chest 2 View  Result Date: 04/08/2017 CLINICAL DATA:  Chest pain and palpitations EXAM: CHEST  2 VIEW COMPARISON:  Chest radiograph 07/26/2015 FINDINGS: The heart size and mediastinal contours are within normal limits. Both lungs are clear. The visualized skeletal structures are unremarkable. IMPRESSION: No active cardiopulmonary disease. Electronically Signed   By: Deatra Robinson M.D.   On: 04/08/2017 02:53    Procedures Procedures (including critical care time)  Medications Ordered in ED Medications  aspirin chewable tablet 324 mg (not administered)     Initial Impression / Assessment and Plan / ED Course  I have reviewed the triage vital signs and the nursing notes.  Pertinent labs & imaging results that were available during my care of the patient were reviewed by me and considered in my medical decision making (see chart for details).  Chest discomfort during episode of palpitations.no ECG changes in the ED, but heart rhythm had gone back to normal. Initial troponin is also normal. She will need to have either a Holter monitor or event monitor to identify what her tachyarrhythmia is. Tonight, she will be kept in the ED for a delta troponin.  Repeat troponin is normal. This is obtained 6 hours after cessation of symptoms, so is sufficient to rule out any cardiac injury. She is referred to cardiology for consideration for Holter monitor or event monitor. Also, recommended she discuss with PCP whether she should resume taking carvedilol. Carvedilol may be helpful in limiting episodes of tachycardia, and decreasing her severity.  Final Clinical Impressions(s) / ED Diagnoses   Final diagnoses:  Palpitations    New Prescriptions New Prescriptions   No medications on file     Dione Booze, MD 04/08/17 [phone]

## 2017-04-08 NOTE — ED Notes (Signed)
Pt states while she was having palpitations, it felt like someone was standing on her chest and she saw "spots". Denies chest discomfort at this time.

## 2017-04-08 NOTE — Discharge Instructions (Signed)
Talk with your doctor or the cardiologist about getting a heart monitor test. If the rapid heart beat happens again, get to the emergency department as soon as possible to give [place] a chance to see the rhythm on an ekg. Talk with your doctor about whether she wants you to take the carvedilol (Coreg) - it will keep your heart from going too fast.

## 2017-04-08 NOTE — ED Triage Notes (Signed)
Per EMS, pt had an episode of palpitations that lasted 5 minutes and then resolved. EMS EKG showed NSR, denies chest pain/shortness of breath. Hx MS and a similar episode a few years ago. Denies hx a-fib.

## 2017-07-25 ENCOUNTER — Other Ambulatory Visit: Payer: Self-pay | Admitting: Cardiology

## 2017-07-25 DIAGNOSIS — I159 Secondary hypertension, unspecified: Secondary | ICD-10-CM

## 2017-07-31 ENCOUNTER — Ambulatory Visit (HOSPITAL_COMMUNITY)
Admission: RE | Admit: 2017-07-31 | Discharge: 2017-07-31 | Disposition: A | Discharge status: Home / Self Care | Payer: Medicare Other | Source: Ambulatory Visit | Attending: Family Medicine | Admitting: Family Medicine

## 2017-07-31 DIAGNOSIS — I071 Rheumatic tricuspid insufficiency: Secondary | ICD-10-CM | POA: Diagnosis not present

## 2017-07-31 DIAGNOSIS — I159 Secondary hypertension, unspecified: Secondary | ICD-10-CM | POA: Insufficient documentation

## 2017-07-31 NOTE — Progress Notes (Signed)
  Echocardiogram 2D Echocardiogram has been performed.  Trudy Kory T Kasmira Cacioppo 07/31/2017, 3:08 PM

## 2017-08-21 ENCOUNTER — Other Ambulatory Visit: Payer: Self-pay | Admitting: Family Medicine

## 2017-08-21 DIAGNOSIS — Z1231 Encounter for screening mammogram for malignant neoplasm of breast: Secondary | ICD-10-CM

## 2017-09-06 ENCOUNTER — Ambulatory Visit: Payer: Medicare Other

## 2017-09-19 ENCOUNTER — Ambulatory Visit: Payer: Medicare Other

## 2017-10-16 IMAGING — CR DG CHEST 1V PORT
1 series · 1 of 1 positions shown · non-contrast
Comparison: Portable exam 9999 hours compared to 06/26/2011

CLINICAL DATA: LEFT chest soreness for 2 weeks radiating to LEFT
arm, history multiple sclerosis, hypertension

EXAM:
PORTABLE CHEST 1 VIEW

[AP]
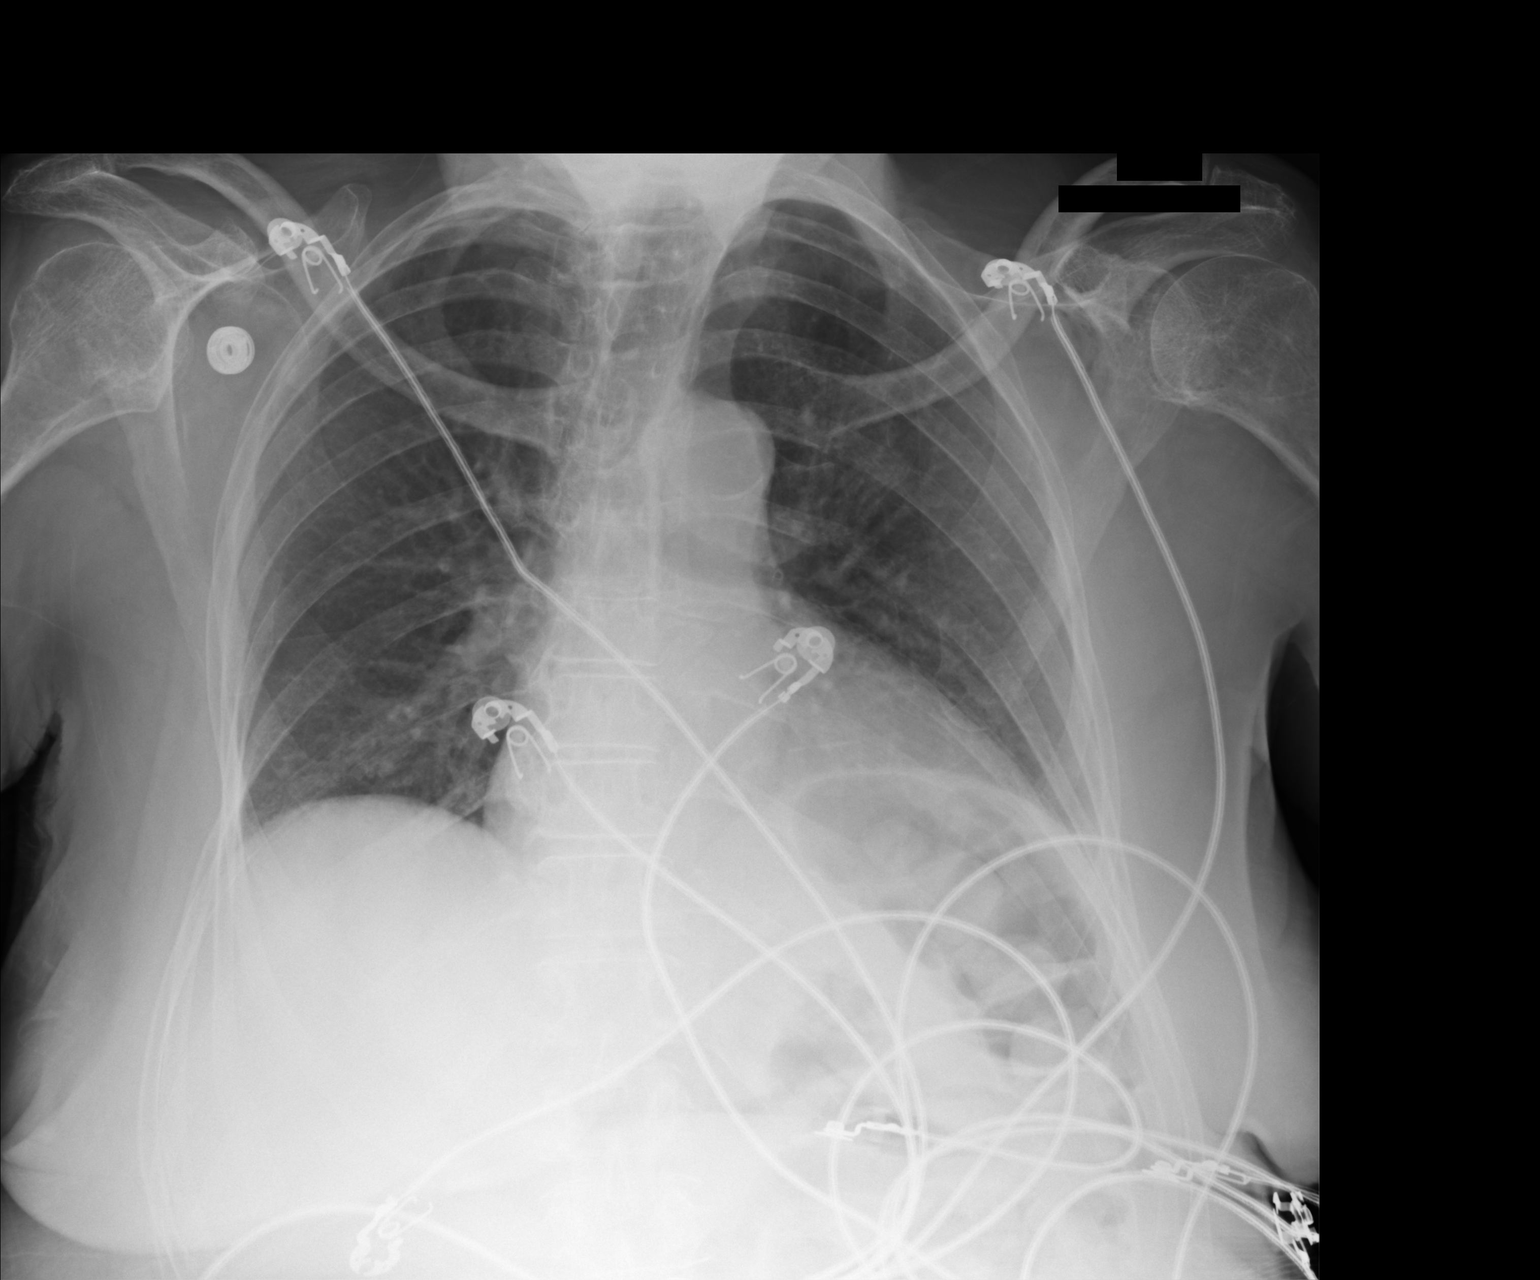

[1 of 1 positions shown; findings below may reference images not displayed]

FINDINGS: Borderline enlargement of cardiac silhouette.

Mediastinal contours and pulmonary vascularity normal.

Minimal bibasilar atelectasis.

Lungs otherwise clear.

No pleural effusion or pneumothorax.

Bones demineralized.
IMPRESSION: Bibasilar atelectasis.

## 2017-10-22 ENCOUNTER — Ambulatory Visit
Admission: RE | Admit: 2017-10-22 | Discharge: 2017-10-22 | Disposition: A | Discharge status: Home / Self Care | Payer: Medicare Other | Source: Ambulatory Visit | Attending: Family Medicine | Admitting: Family Medicine

## 2017-10-22 DIAGNOSIS — Z1231 Encounter for screening mammogram for malignant neoplasm of breast: Secondary | ICD-10-CM

## 2018-03-19 ENCOUNTER — Other Ambulatory Visit: Payer: Self-pay

## 2018-03-19 ENCOUNTER — Encounter: Payer: Self-pay | Admitting: Neurology

## 2018-03-19 ENCOUNTER — Ambulatory Visit (INDEPENDENT_AMBULATORY_CARE_PROVIDER_SITE_OTHER): Payer: Medicare Other | Admitting: Neurology

## 2018-03-19 VITALS — BP 156/79 | HR 60 | Resp 18 | Ht 61.0 in | Wt 121.0 lb

## 2018-03-19 DIAGNOSIS — N3941 Urge incontinence: Secondary | ICD-10-CM | POA: Diagnosis not present

## 2018-03-19 DIAGNOSIS — G35 Multiple sclerosis: Secondary | ICD-10-CM | POA: Diagnosis not present

## 2018-03-19 DIAGNOSIS — G8250 Quadriplegia, unspecified: Secondary | ICD-10-CM | POA: Insufficient documentation

## 2018-03-19 DIAGNOSIS — R262 Difficulty in walking, not elsewhere classified: Secondary | ICD-10-CM
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
## 2018-05-09 ENCOUNTER — Telehealth: Payer: Self-pay | Admitting: Neurology

## 2018-05-09 NOTE — Telephone Encounter (Signed)
I called [facility] and spoke with Toney Reil who stated that [phone] and 646 codes do not require auth ref#[id number].

## 2018-05-16 ENCOUNTER — Ambulatory Visit (INDEPENDENT_AMBULATORY_CARE_PROVIDER_SITE_OTHER): Payer: Medicare Other | Admitting: Neurology

## 2018-05-16 ENCOUNTER — Encounter: Payer: Self-pay | Admitting: Neurology

## 2018-05-16 ENCOUNTER — Telehealth: Payer: Self-pay | Admitting: Neurology

## 2018-05-16 DIAGNOSIS — G35 Multiple sclerosis: Secondary | ICD-10-CM

## 2018-05-16 DIAGNOSIS — G8250 Quadriplegia, unspecified: Secondary | ICD-10-CM
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
## 2018-05-16 NOTE — Telephone Encounter (Signed)
3 mo Botox inj  °

## 2018-05-20 NOTE — Telephone Encounter (Signed)
I called to schedule the patient but she did not answer so I left a VM asking her to call back.  

## 2018-07-15 ENCOUNTER — Other Ambulatory Visit: Payer: Self-pay

## 2018-07-15 ENCOUNTER — Emergency Department (HOSPITAL_COMMUNITY)
Admission: EM | Admit: 2018-07-15 | Discharge: 2018-07-15 | Disposition: A | Discharge status: Home / Self Care | Payer: Medicare Other | Attending: Emergency Medicine | Admitting: Emergency Medicine

## 2018-07-15 ENCOUNTER — Emergency Department (HOSPITAL_COMMUNITY): Payer: Medicare Other

## 2018-07-15 DIAGNOSIS — G35 Multiple sclerosis: Secondary | ICD-10-CM | POA: Insufficient documentation

## 2018-07-15 DIAGNOSIS — W19XXXA Unspecified fall, initial encounter: Secondary | ICD-10-CM

## 2018-07-15 DIAGNOSIS — Y999 Unspecified external cause status: Secondary | ICD-10-CM | POA: Diagnosis not present

## 2018-07-15 DIAGNOSIS — W010XXA Fall on same level from slipping, tripping and stumbling without subsequent striking against object, initial encounter: Secondary | ICD-10-CM | POA: Diagnosis not present

## 2018-07-15 DIAGNOSIS — S0101XA Laceration without foreign body of scalp, initial encounter: Secondary | ICD-10-CM | POA: Insufficient documentation

## 2018-07-15 DIAGNOSIS — Y9301 Activity, walking, marching and hiking: Secondary | ICD-10-CM | POA: Insufficient documentation

## 2018-07-15 DIAGNOSIS — S0990XA Unspecified injury of head, initial encounter: Secondary | ICD-10-CM | POA: Diagnosis present

## 2018-07-15 DIAGNOSIS — Y929 Unspecified place or not applicable: Secondary | ICD-10-CM | POA: Insufficient documentation

## 2018-07-15 DIAGNOSIS — I10 Essential (primary) hypertension: Secondary | ICD-10-CM | POA: Insufficient documentation

## 2018-07-15 DIAGNOSIS — M545 Low back pain, unspecified: Secondary | ICD-10-CM

## 2018-07-15 MED ORDER — METHOCARBAMOL 500 MG PO TABS: 500.0000 mg | ORAL_TABLET | Freq: Three times a day (TID) | ORAL | 0 refills | Status: DC | PRN

## 2018-07-15 MED ORDER — HYDROCODONE-ACETAMINOPHEN 5-325 MG PO TABS
1.0000 | ORAL_TABLET | Freq: Once | ORAL | Status: AC
  Administered 2018-07-15: 1 via ORAL
  Filled 2018-07-15: qty 1

## 2018-07-15 NOTE — ED Notes (Signed)
[facility] arrived to transport patient. 

## 2018-07-15 NOTE — ED Notes (Signed)
D/C reviewed with patient and son.  [facility] called to transport patient

## 2018-07-15 NOTE — Discharge Instructions (Addendum)
You were evaluated in the Emergency Department and after careful evaluation, we did not find any emergent condition requiring admission or further testing in the hospital.  Your symptoms today seem to be due to bruising related to the fall today.  Use Tylenol at home for pain.  You can also use the muscle relaxer provided.  Please return to the Emergency Department if you experience any worsening of your condition.  We encourage you to follow up with a primary care provider.  Thank you for allowing us to be a part of your care.
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
## 2018-07-15 NOTE — ED Triage Notes (Signed)
Patient arrived from home via [facility] reporting a fall. Patient reports falling as she tried to get on her walker, and has an abrasion to posterior head.

## 2018-07-18 ENCOUNTER — Telehealth: Payer: Self-pay | Admitting: Neurology

## 2018-07-18 NOTE — Telephone Encounter (Signed)
Pt's son Sigmund/DPR request to r./s botox appt. Please call to advise

## 2018-07-22 NOTE — Telephone Encounter (Signed)
I returned the call but it went straight to VM. I left a VM asking them to call me back.

## 2018-07-24 ENCOUNTER — Other Ambulatory Visit: Payer: Self-pay | Admitting: Neurology

## 2018-07-24 NOTE — Telephone Encounter (Signed)
Pt son(on DPR-Williams,Sigmund) is asking for a call to discuss r/s Botox appointment

## 2018-07-24 NOTE — Telephone Encounter (Signed)
I called and rescheduled the apt. DW

## 2018-08-22 ENCOUNTER — Ambulatory Visit: Payer: Medicare Other | Admitting: Neurology

## 2018-08-30 MED ORDER — ONABOTULINUMTOXINA 100 UNITS IJ SOLR: INTRAMUSCULAR | 3 refills | Status: DC

## 2018-08-30 NOTE — Telephone Encounter (Signed)
Emma, please send a script for this patients Botox over to [facility]. [place].

## 2018-08-30 NOTE — Addendum Note (Signed)
Addended by: Eilene Ghazi L on: 08/30/2018 10:10 AM   Modules accepted: Orders

## 2018-09-04 ENCOUNTER — Other Ambulatory Visit: Payer: Self-pay | Admitting: *Deleted

## 2018-09-04 MED ORDER — ONABOTULINUMTOXINA 100 UNITS IJ SOLR: INTRAMUSCULAR | 3 refills | Status: DC

## 2018-09-05 ENCOUNTER — Telehealth: Payer: Self-pay | Admitting: Neurology

## 2018-09-05 NOTE — Telephone Encounter (Signed)
I called [employer] to check codes [id number] and [id number]- NPR ref#[id number]. DW

## 2018-09-13 ENCOUNTER — Ambulatory Visit (INDEPENDENT_AMBULATORY_CARE_PROVIDER_SITE_OTHER): Payer: Medicare Other | Admitting: Neurology

## 2018-09-13 ENCOUNTER — Telehealth: Payer: Self-pay | Admitting: Neurology

## 2018-09-13 ENCOUNTER — Encounter: Payer: Self-pay | Admitting: Neurology

## 2018-09-13 VITALS — BP 130/78 | HR 71

## 2018-09-13 DIAGNOSIS — G8250 Quadriplegia, unspecified: Secondary | ICD-10-CM | POA: Diagnosis not present

## 2018-09-13 DIAGNOSIS — N3941 Urge incontinence: Secondary | ICD-10-CM

## 2018-09-13 DIAGNOSIS — G35 Multiple sclerosis: Secondary | ICD-10-CM | POA: Diagnosis not present
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
## 2018-09-13 NOTE — Telephone Encounter (Signed)
3 mo Botox inj 400 units

## 2018-09-17 NOTE — Telephone Encounter (Signed)
I called to schedule the patient and spoke with her son (on the [place]) he stated they would call back to schedule later today or tomorrow. DW

## 2018-10-21 ENCOUNTER — Other Ambulatory Visit: Payer: Self-pay | Admitting: Family Medicine

## 2018-10-21 DIAGNOSIS — Z1231 Encounter for screening mammogram for malignant neoplasm of breast: Secondary | ICD-10-CM

## 2018-12-12 ENCOUNTER — Telehealth: Payer: Self-pay | Admitting: *Deleted

## 2018-12-12 NOTE — Telephone Encounter (Signed)
LVM for pt to call today about appt scheduled for 12/16/18 with Dr. Epimenio Foot for her botox inj.

## 2018-12-12 NOTE — Telephone Encounter (Signed)
Son called back. Pt passed covid-19 screening. Showing no signs/sx of coronavirus. Has not been exposed to anyone with virus. Advised that she wear mask when she comes. Explained new check in process. She is coming by [employer] [facility]. Asked they let check in ladies know when she gets there and I will come get her. He verbalized understanding.

## 2018-12-16 ENCOUNTER — Other Ambulatory Visit: Payer: Self-pay

## 2018-12-16 ENCOUNTER — Encounter: Payer: Self-pay | Admitting: Neurology

## 2018-12-16 ENCOUNTER — Ambulatory Visit (INDEPENDENT_AMBULATORY_CARE_PROVIDER_SITE_OTHER): Payer: Medicare Other | Admitting: Neurology

## 2018-12-16 ENCOUNTER — Telehealth: Payer: Self-pay | Admitting: Neurology

## 2018-12-16 VITALS — BP 142/78 | HR 60 | Temp 98.1°F | Ht 61.0 in

## 2018-12-16 DIAGNOSIS — G8250 Quadriplegia, unspecified: Secondary | ICD-10-CM | POA: Diagnosis not present

## 2018-12-16 DIAGNOSIS — N3941 Urge incontinence: Secondary | ICD-10-CM | POA: Diagnosis not present

## 2018-12-16 DIAGNOSIS — R262 Difficulty in walking, not elsewhere classified: Secondary | ICD-10-CM

## 2018-12-16 DIAGNOSIS — G35 Multiple sclerosis: Secondary | ICD-10-CM | POA: Diagnosis not present

## 2018-12-16 MED ORDER — BACLOFEN 10 MG PO TABS: 10.0000 mg | ORAL_TABLET | Freq: Three times a day (TID) | ORAL | 5 refills | Status: DC
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
## 2018-12-16 NOTE — Patient Instructions (Signed)
Return in 3 months for Botox  I called in baclofen, a muscle relaxant to [facility]).   It may work better than methocarbamol for the spasms.

## 2018-12-16 NOTE — Telephone Encounter (Signed)
3 mos btx °

## 2018-12-20 ENCOUNTER — Ambulatory Visit
Admission: RE | Admit: 2018-12-20 | Discharge: 2018-12-20 | Disposition: A | Discharge status: Home / Self Care | Payer: Medicare Other | Source: Ambulatory Visit | Attending: Family Medicine | Admitting: Family Medicine

## 2018-12-20 ENCOUNTER — Other Ambulatory Visit: Payer: Self-pay

## 2018-12-20 DIAGNOSIS — Z1231 Encounter for screening mammogram for malignant neoplasm of breast: Secondary | ICD-10-CM

## 2018-12-30 NOTE — Telephone Encounter (Signed)
I called to schedule with the patient but someone in her home answered and stated she was not home. I asked him to have her call me back at her earliest convenience. DW

## 2019-03-19 ENCOUNTER — Telehealth: Payer: Self-pay | Admitting: Neurology

## 2019-03-19 ENCOUNTER — Ambulatory Visit (INDEPENDENT_AMBULATORY_CARE_PROVIDER_SITE_OTHER): Payer: Medicare Other | Admitting: Neurology

## 2019-03-19 ENCOUNTER — Other Ambulatory Visit: Payer: Self-pay

## 2019-03-19 VITALS — BP 142/80 | HR 62 | Temp 97.3°F | Ht 61.0 in

## 2019-03-19 DIAGNOSIS — R262 Difficulty in walking, not elsewhere classified: Secondary | ICD-10-CM

## 2019-03-19 DIAGNOSIS — G35 Multiple sclerosis: Secondary | ICD-10-CM

## 2019-03-19 DIAGNOSIS — G8250 Quadriplegia, unspecified: Secondary | ICD-10-CM

## 2019-03-19 DIAGNOSIS — N3941 Urge incontinence: Secondary | ICD-10-CM | POA: Diagnosis not present
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
## 2019-03-19 NOTE — Telephone Encounter (Signed)
Return in about 3 months (around 06/18/2019) for Botox for MS spasticity x 400 U.

## 2019-03-25 NOTE — Telephone Encounter (Signed)
I called to schedule the patient but she did not answer so I left a VM asking her to call back. I went ahead and made an apt if she calls back please confirm this works for her. DW

## 2019-03-28 ENCOUNTER — Telehealth: Payer: Self-pay | Admitting: Neurology

## 2019-03-28 NOTE — Telephone Encounter (Signed)
Pt called back and confirmed that this appt is good for her.

## 2019-03-28 NOTE — Telephone Encounter (Signed)
Pt wants to know if her baclofen (LIORESAL) 10 MG tablet can be prescribed to her on a lower dosage due to her being to sleepy and "loopy" on the 10mg . Please advise.

## 2019-03-31 NOTE — Telephone Encounter (Signed)
Noted, thank you. DW  °

## 2019-03-31 NOTE — Telephone Encounter (Signed)
Have her cut the tablets in half as 10 is the smallest pill.    If 1/2 pill not tolerated, we will try something else

## 2019-03-31 NOTE — Telephone Encounter (Signed)
Dr. Sater- please advise 

## 2019-03-31 NOTE — Telephone Encounter (Signed)
Called and spoke with Sigmond (son on [place]). Sherea was not available. Relayed per Dr. Felecia Shelling that she can take 1/2 tablet (5mg ) instead to see if she tolerates this better. He verbalized understanding.

## 2019-06-19 ENCOUNTER — Ambulatory Visit: Payer: Medicare Other | Admitting: Neurology

## 2019-09-29 ENCOUNTER — Telehealth: Payer: Self-pay | Admitting: Neurology

## 2019-10-21 ENCOUNTER — Encounter: Payer: Self-pay | Admitting: Adult Health

## 2019-10-21 ENCOUNTER — Ambulatory Visit (INDEPENDENT_AMBULATORY_CARE_PROVIDER_SITE_OTHER): Payer: Medicare Other | Admitting: Adult Health

## 2019-10-21 ENCOUNTER — Other Ambulatory Visit: Payer: Self-pay

## 2019-10-21 VITALS — BP 134/76 | HR 84 | Temp 97.2°F | Ht 61.0 in | Wt 121.0 lb

## 2019-10-21 DIAGNOSIS — G8250 Quadriplegia, unspecified: Secondary | ICD-10-CM | POA: Diagnosis not present

## 2019-10-21 DIAGNOSIS — G35 Multiple sclerosis: Secondary | ICD-10-CM
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
## 2019-10-21 NOTE — Patient Instructions (Addendum)
Your Plan:  Continue to monitor symptoms  If you decided to try botox again just call us Call if you want to try Ocrevus again If your symptoms worsen or you develop new symptoms please let us know.   Thank you for coming to see [place] at [facility]. I hope we have been able to provide you high quality care today.  You may receive a patient satisfaction survey over the next few weeks. We would appreciate your feedback and comments so that we may continue to improve ourselves and the health of our patients.

## 2019-10-21 NOTE — Addendum Note (Signed)
Addended by: Enedina Finner on: 10/21/2019 11:12 AM   Modules accepted: Level of Service

## 2019-10-21 NOTE — Progress Notes (Signed)
I have read the note, and I agree with the clinical assessment and plan.  Orrie Schubert A. Staceyann Knouff, MD, PhD, FAAN Certified in Neurology, Clinical Neurophysiology, Sleep Medicine, Pain Medicine and Neuroimaging  [facility] [address], [address] [place], [place] [address] [phone]  

## 2019-10-22 DIAGNOSIS — Z0289 Encounter for other administrative examinations: Secondary | ICD-10-CM

## 2019-11-03 ENCOUNTER — Telehealth: Payer: Self-pay | Admitting: *Deleted

## 2019-11-03 NOTE — Telephone Encounter (Signed)
Scat form completed.  To MR.

## 2019-12-16 ENCOUNTER — Other Ambulatory Visit: Payer: Self-pay | Admitting: Family Medicine

## 2019-12-16 DIAGNOSIS — Z1231 Encounter for screening mammogram for malignant neoplasm of breast: Secondary | ICD-10-CM

## 2020-01-14 ENCOUNTER — Ambulatory Visit
Admission: RE | Admit: 2020-01-14 | Discharge: 2020-01-14 | Disposition: A | Discharge status: Home / Self Care | Payer: Medicare Other | Source: Ambulatory Visit | Attending: Family Medicine | Admitting: Family Medicine

## 2020-01-14 ENCOUNTER — Other Ambulatory Visit: Payer: Self-pay

## 2020-01-14 DIAGNOSIS — Z1231 Encounter for screening mammogram for malignant neoplasm of breast: Secondary | ICD-10-CM

## 2020-02-19 ENCOUNTER — Other Ambulatory Visit: Payer: Self-pay | Admitting: Family Medicine

## 2020-02-19 DIAGNOSIS — R5381 Other malaise: Secondary | ICD-10-CM

## 2020-02-24 ENCOUNTER — Other Ambulatory Visit: Payer: Self-pay | Admitting: Family Medicine

## 2020-02-24 DIAGNOSIS — E2839 Other primary ovarian failure: Secondary | ICD-10-CM

## 2020-03-09 LAB — COLOGUARD: COLOGUARD: NEGATIVE

## 2020-04-22 ENCOUNTER — Encounter: Payer: Self-pay | Admitting: Neurology

## 2020-04-22 ENCOUNTER — Ambulatory Visit (INDEPENDENT_AMBULATORY_CARE_PROVIDER_SITE_OTHER): Payer: Medicare Other | Admitting: Neurology

## 2020-04-22 ENCOUNTER — Other Ambulatory Visit: Payer: Self-pay

## 2020-04-22 VITALS — BP 165/80 | HR 64 | Ht 61.0 in

## 2020-04-22 DIAGNOSIS — G35 Multiple sclerosis: Secondary | ICD-10-CM | POA: Diagnosis not present

## 2020-04-22 DIAGNOSIS — M7551 Bursitis of right shoulder: Secondary | ICD-10-CM | POA: Diagnosis not present

## 2020-04-22 DIAGNOSIS — R262 Difficulty in walking, not elsewhere classified: Secondary | ICD-10-CM

## 2020-04-22 DIAGNOSIS — G8250 Quadriplegia, unspecified: Secondary | ICD-10-CM

## 2020-04-22 MED ORDER — BACLOFEN 10 MG PO TABS: 5.0000 mg | ORAL_TABLET | Freq: Three times a day (TID) | ORAL | 5 refills | Status: DC
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
## 2020-05-18 ENCOUNTER — Telehealth: Payer: Self-pay | Admitting: Neurology

## 2020-05-18 NOTE — Telephone Encounter (Signed)
Dusti from [facility] called to request a copy of office visit notes for hoyer lift request.  Phone number: [phone]

## 2020-05-18 NOTE — Telephone Encounter (Signed)
Done copy faxed to [facility] @ [facility].

## 2020-05-18 NOTE — Telephone Encounter (Signed)
Danielle Mccarthy, can you help with this?

## 2020-05-27 ENCOUNTER — Other Ambulatory Visit: Payer: Self-pay

## 2020-05-27 ENCOUNTER — Ambulatory Visit
Admission: RE | Admit: 2020-05-27 | Discharge: 2020-05-27 | Disposition: A | Discharge status: Home / Self Care | Payer: Medicare Other | Source: Ambulatory Visit | Attending: Family Medicine | Admitting: Family Medicine

## 2020-05-27 DIAGNOSIS — E2839 Other primary ovarian failure: Secondary | ICD-10-CM

## 2020-06-25 ENCOUNTER — Emergency Department (HOSPITAL_COMMUNITY): Payer: Medicare Other

## 2020-06-25 ENCOUNTER — Encounter (HOSPITAL_COMMUNITY): Payer: Self-pay

## 2020-06-25 ENCOUNTER — Observation Stay (HOSPITAL_BASED_OUTPATIENT_CLINIC_OR_DEPARTMENT_OTHER): Payer: Medicare Other

## 2020-06-25 ENCOUNTER — Other Ambulatory Visit: Payer: Self-pay

## 2020-06-25 ENCOUNTER — Observation Stay (HOSPITAL_COMMUNITY)
Admission: EM | Admit: 2020-06-25 | Discharge: 2020-06-26 | Disposition: A | Discharge status: Home / Self Care | Payer: Medicare Other | Attending: Family Medicine | Admitting: Family Medicine

## 2020-06-25 DIAGNOSIS — I10 Essential (primary) hypertension: Secondary | ICD-10-CM | POA: Diagnosis not present

## 2020-06-25 DIAGNOSIS — I471 Supraventricular tachycardia, unspecified: Secondary | ICD-10-CM | POA: Insufficient documentation

## 2020-06-25 DIAGNOSIS — Z20822 Contact with and (suspected) exposure to covid-19: Secondary | ICD-10-CM | POA: Diagnosis not present

## 2020-06-25 DIAGNOSIS — G35 Multiple sclerosis: Secondary | ICD-10-CM | POA: Diagnosis present

## 2020-06-25 DIAGNOSIS — R55 Syncope and collapse: Secondary | ICD-10-CM | POA: Diagnosis present

## 2020-06-25 DIAGNOSIS — T68XXXA Hypothermia, initial encounter: Secondary | ICD-10-CM | POA: Diagnosis not present

## 2020-06-25 DIAGNOSIS — Z79899 Other long term (current) drug therapy: Secondary | ICD-10-CM | POA: Diagnosis not present

## 2020-06-25 DIAGNOSIS — Z7189 Other specified counseling: Secondary | ICD-10-CM

## 2020-06-25 DIAGNOSIS — X31XXXA Exposure to excessive natural cold, initial encounter: Secondary | ICD-10-CM | POA: Insufficient documentation

## 2020-06-25 DIAGNOSIS — Z66 Do not resuscitate: Secondary | ICD-10-CM

## 2020-06-25 DIAGNOSIS — G800 Spastic quadriplegic cerebral palsy: Secondary | ICD-10-CM | POA: Diagnosis not present

## 2020-06-25 DIAGNOSIS — R42 Dizziness and giddiness: Secondary | ICD-10-CM

## 2020-06-25 DIAGNOSIS — G8250 Quadriplegia, unspecified: Secondary | ICD-10-CM | POA: Diagnosis present

## 2020-06-25 DIAGNOSIS — Z515 Encounter for palliative care: Secondary | ICD-10-CM

## 2020-06-25 DIAGNOSIS — Z789 Other specified health status: Secondary | ICD-10-CM

## 2020-06-25 LAB — RAPID URINE DRUG SCREEN, HOSP PERFORMED
Amphetamines: NOT DETECTED
Barbiturates: NOT DETECTED
Benzodiazepines: NOT DETECTED
Cocaine: NOT DETECTED
Opiates: NOT DETECTED
Tetrahydrocannabinol: NOT DETECTED

## 2020-06-25 LAB — URINALYSIS, ROUTINE W REFLEX MICROSCOPIC
Bilirubin Urine: NEGATIVE
Glucose, UA: NEGATIVE mg/dL
Ketones, ur: NEGATIVE mg/dL
Leukocytes,Ua: NEGATIVE
Nitrite: POSITIVE — AB
Protein, ur: NEGATIVE mg/dL
Specific Gravity, Urine: 1.024 (ref 1.005–1.030)
pH: 5 (ref 5.0–8.0)

## 2020-06-25 LAB — TROPONIN I (HIGH SENSITIVITY)
Troponin I (High Sensitivity): 3 ng/L (ref <18)
Troponin I (High Sensitivity): 3 ng/L (ref <18)

## 2020-06-25 LAB — I-STAT CHEM 8, ED
BUN: 18 mg/dL (ref 8–23)
Calcium, Ion: 1.19 mmol/L (ref 1.15–1.40)
Chloride: 109 mmol/L (ref 98–111)
Creatinine, Ser: 0.7 mg/dL (ref 0.44–1.00)
Glucose, Bld: 126 mg/dL — ABNORMAL HIGH (ref 70–99)
HCT: 45 % (ref 36.0–46.0)
Hemoglobin: 15.3 g/dL — ABNORMAL HIGH (ref 12.0–15.0)
Potassium: 3.9 mmol/L (ref 3.5–5.1)
Sodium: 142 mmol/L (ref 135–145)
TCO2: 24 mmol/L (ref 22–32)

## 2020-06-25 LAB — DIFFERENTIAL
Abs Immature Granulocytes: 0.02 10*3/uL (ref 0.00–0.07)
Basophils Absolute: 0.1 10*3/uL (ref 0.0–0.1)
Basophils Relative: 1 %
Eosinophils Absolute: 0.2 10*3/uL (ref 0.0–0.5)
Eosinophils Relative: 3 %
Immature Granulocytes: 0 %
Lymphocytes Relative: 40 %
Lymphs Abs: 2 10*3/uL (ref 0.7–4.0)
Monocytes Absolute: 0.3 10*3/uL (ref 0.1–1.0)
Monocytes Relative: 7 %
Neutro Abs: 2.4 10*3/uL (ref 1.7–7.7)
Neutrophils Relative %: 49 %

## 2020-06-25 LAB — COMPREHENSIVE METABOLIC PANEL
ALT: 12 U/L (ref 0–44)
AST: 30 U/L (ref 15–41)
Albumin: 2.9 g/dL — ABNORMAL LOW (ref 3.5–5.0)
Alkaline Phosphatase: 62 U/L (ref 38–126)
Anion gap: 9 (ref 5–15)
BUN: 16 mg/dL (ref 8–23)
CO2: 24 mmol/L (ref 22–32)
Calcium: 8.6 mg/dL — ABNORMAL LOW (ref 8.9–10.3)
Chloride: 106 mmol/L (ref 98–111)
Creatinine, Ser: 0.8 mg/dL (ref 0.44–1.00)
GFR, Estimated: >60 mL/min (ref >60)
Glucose, Bld: 143 mg/dL — ABNORMAL HIGH (ref 70–99)
Potassium: 5.1 mmol/L (ref 3.5–5.1)
Sodium: 139 mmol/L (ref 135–145)
Total Bilirubin: 0.8 mg/dL (ref 0.3–1.2)
Total Protein: 5.8 g/dL — ABNORMAL LOW (ref 6.5–8.1)

## 2020-06-25 LAB — RESP PANEL BY RT-PCR (FLU A&B, COVID) ARPGX2
Influenza A by PCR: NEGATIVE
Influenza B by PCR: NEGATIVE
SARS Coronavirus 2 by RT PCR: NEGATIVE

## 2020-06-25 LAB — PROTIME-INR
INR: 1.1 (ref 0.8–1.2)
Prothrombin Time: 14.1 seconds (ref 11.4–15.2)

## 2020-06-25 LAB — CBC
HCT: 41.4 % (ref 36.0–46.0)
Hemoglobin: 12.3 g/dL (ref 12.0–15.0)
MCH: 25.8 pg — ABNORMAL LOW (ref 26.0–34.0)
MCHC: 29.7 g/dL — ABNORMAL LOW (ref 30.0–36.0)
MCV: 86.8 fL (ref 80.0–100.0)
Platelets: 191 10*3/uL (ref 150–400)
RBC: 4.77 MIL/uL (ref 3.87–5.11)
RDW: 14.7 % (ref 11.5–15.5)
WBC: 5 10*3/uL (ref 4.0–10.5)
nRBC: 0 % (ref 0.0–0.2)

## 2020-06-25 LAB — ETHANOL: Alcohol, Ethyl (B): <10 mg/dL (ref <10)

## 2020-06-25 LAB — ECHOCARDIOGRAM COMPLETE
Area-P 1/2: 2.45 cm2
S' Lateral: 2.8 cm
Weight: 2377.44 oz

## 2020-06-25 LAB — TSH: TSH: 0.641 u[IU]/mL (ref 0.350–4.500)

## 2020-06-25 LAB — APTT: aPTT: 31 seconds (ref 24–36)

## 2020-06-25 LAB — CBG MONITORING, ED: Glucose-Capillary: 128 mg/dL — ABNORMAL HIGH (ref 70–99)

## 2020-06-25 LAB — HIV ANTIBODY (ROUTINE TESTING W REFLEX): HIV Screen 4th Generation wRfx: NONREACTIVE

## 2020-06-25 MED ORDER — ACETAMINOPHEN 325 MG PO TABS: 650.0000 mg | ORAL_TABLET | Freq: Four times a day (QID) | ORAL | Status: DC | PRN

## 2020-06-25 MED ORDER — GADOBUTROL 1 MMOL/ML IV SOLN
6.5000 mL | Freq: Once | INTRAVENOUS | Status: AC | PRN
  Administered 2020-06-25: 6.5 mL via INTRAVENOUS

## 2020-06-25 MED ORDER — DOCUSATE SODIUM 100 MG PO CAPS
100.0000 mg | ORAL_CAPSULE | Freq: Two times a day (BID) | ORAL | Status: DC
  Administered 2020-06-25 – 2020-06-26 (×3): 100 mg via ORAL
  Filled 2020-06-25 (×3): qty 1

## 2020-06-25 MED ORDER — POLYETHYLENE GLYCOL 3350 17 G PO PACK: 17.0000 g | PACK | Freq: Every day | ORAL | Status: DC | PRN

## 2020-06-25 MED ORDER — BACLOFEN 5 MG HALF TABLET
5.0000 mg | ORAL_TABLET | Freq: Three times a day (TID) | ORAL | Status: DC
  Administered 2020-06-25 – 2020-06-26 (×4): 5 mg via ORAL
  Filled 2020-06-25 (×5): qty 1

## 2020-06-25 MED ORDER — BISACODYL 5 MG PO TBEC: 5.0000 mg | DELAYED_RELEASE_TABLET | Freq: Every day | ORAL | Status: DC | PRN

## 2020-06-25 MED ORDER — LACTATED RINGERS IV SOLN: INTRAVENOUS | Status: DC

## 2020-06-25 MED ORDER — SODIUM CHLORIDE 0.9% FLUSH
3.0000 mL | Freq: Two times a day (BID) | INTRAVENOUS | Status: DC
  Administered 2020-06-25 – 2020-06-26 (×2): 3 mL via INTRAVENOUS

## 2020-06-25 MED ORDER — ACETAMINOPHEN 650 MG RE SUPP: 650.0000 mg | Freq: Four times a day (QID) | RECTAL | Status: DC | PRN

## 2020-06-25 MED ORDER — LORAZEPAM 2 MG/ML IJ SOLN: 1.0000 mg | Freq: Once | INTRAMUSCULAR | Status: DC | PRN

## 2020-06-25 MED ORDER — MORPHINE SULFATE (PF) 2 MG/ML IV SOLN: 2.0000 mg | INTRAVENOUS | Status: DC | PRN

## 2020-06-25 MED ORDER — ONDANSETRON HCL 4 MG PO TABS: 4.0000 mg | ORAL_TABLET | Freq: Four times a day (QID) | ORAL | Status: DC | PRN

## 2020-06-25 MED ORDER — ENOXAPARIN SODIUM 40 MG/0.4ML ~~LOC~~ SOLN
40.0000 mg | SUBCUTANEOUS | Status: DC
  Administered 2020-06-25: 40 mg via SUBCUTANEOUS
  Filled 2020-06-25: qty 0.4

## 2020-06-25 MED ORDER — ONDANSETRON HCL 4 MG/2ML IJ SOLN: 4.0000 mg | Freq: Four times a day (QID) | INTRAMUSCULAR | Status: DC | PRN

## 2020-06-25 NOTE — ED Provider Notes (Signed)
[facility] EMERGENCY DEPARTMENT Provider Note   CSN: [id number] Arrival date & time: 06/25/20  0830  An emergency department physician performed an initial assessment on this suspected stroke patient at 16.  History Chief Complaint  Patient presents with  . Weakness    Danielle Mccarthy is a 69 y.o. female who presents via EMS with dizziness and diaphoresis.  She has advanced MS, very poor hearing, poor eyesight.  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  She lives with her son.  Due to patient's current state, very poor hearing and being in overall poor historian gathering history was extremely difficult.  Apparently she was doing fine when she awoke however while she was on her bedside commode she suddenly slumped over, was lethargic and semiconscious, became covered in diaphoretic sweat and began vomiting.  She complains of room spinning sensation and's spots before her eyes during the event.  She seems to have awoken without the symptoms and it began 40 minutes prior to my initial evaluation at which time I initiated a code stroke.  The patient denies chest pain.  Upon arrival from EMS she was covered in sweat and had soaked through the stretcher cover and there was a collection of diaphoretic sweat covering the stretcher.  The patient denies headache.  Okay so apparently him bodies his  HPI     Past Medical History:  Diagnosis Date  . Breast mass    mass left breast  . Hypertension   . Multiple sclerosis (HCC)   . Vision abnormalities     Patient Active Problem List   Diagnosis Date Noted  . Syncope 06/25/2020  . Subacromial bursitis of right shoulder joint 04/22/2020  . 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 Unable to walk 03/19/2018  . Urge incontinence 03/19/2018  . Lymphopenia 06/29/2011  . Influenza A 06/28/2011  . Edema of lower extremity 06/27/2011  . Cellulitis and abscess of leg 06/27/2011  . Fever 06/26/2011  . Multiple  sclerosis (HCC)   . Hypertension     Past Surgical History:  Procedure Laterality Date  . KNEE SURGERY Left   . PARTIAL HYSTERECTOMY       OB History   No obstetric history on file.     Family History  Problem Relation Age of Onset  . Hypertension Brother   . Heart failure Mother   . Kidney failure Mother   . Other Father 31       car accident    Social History   Tobacco Use  . Smoking status: Never Smoker  . Smokeless tobacco: Never Used  Substance Use Topics  . Alcohol use: No  . Drug use: No    Home Medications Prior to Admission medications   Medication Sig Start Date End Date Taking? Authorizing Provider  baclofen (LIORESAL) 10 MG tablet Take 0.5 tablets (5 mg total) by mouth 3 (three) times daily. 04/22/20  Yes Sater, Pearletha Furl, MD  carvedilol (COREG) 25 MG tablet Take 25 mg by mouth.   Yes [provider]    Allergies    Patient has no known allergies.  Review of Systems   Review of Systems Ten systems reviewed and are negative for acute change, except as noted in the HPI.   Physical Exam Updated Vital Signs BP 112/74   Pulse (!) 58   Temp (!) 96.1 F (35.6 C) (Oral)   Resp 15   Wt 67.4 kg   SpO2 99%   BMI 28.08 kg/m  Physical Exam Vitals and nursing note reviewed.  Constitutional:      Appearance: She is well-developed and well-nourished. She is diaphoretic.  HENT:     Head: Normocephalic and atraumatic.  Eyes:     General: No scleral icterus.    Extraocular Movements: Extraocular movements intact.     Right eye: Nystagmus present.     Left eye: Nystagmus present.     Conjunctiva/sclera: Conjunctivae normal.     Comments: Patient keeps her eyes shut tightly. Bilateral right sided lateral nystagmus present   Cardiovascular:     Rate and Rhythm: Normal rate and regular rhythm.     Heart sounds: Normal heart sounds. No murmur heard. No friction rub. No gallop.   Pulmonary:     Effort: Pulmonary effort is normal. No  respiratory distress.     Breath sounds: Normal breath sounds.  Abdominal:     General: Bowel sounds are normal. There is no distension.     Palpations: Abdomen is soft. There is no mass.     Tenderness: There is no abdominal tenderness. There is no guarding.  Musculoskeletal:     Cervical back: Normal range of motion.  Skin:    General: Skin is warm.  Neurological:     Mental Status: Mental status is at baseline. She is lethargic.     Cranial Nerves: No dysarthria.     Motor: Weakness and abnormal muscle tone present.     Comments: Weak in the bilateral upper extremities with spasticity, unable to move bilateral lower extremities  Psychiatric:        Behavior: Behavior normal.     ED Results / Procedures / Treatments   Labs (all labs ordered are listed, but only abnormal results are displayed) Labs Reviewed  CBC - Abnormal; Notable for the following components:      Result Value   MCH 25.8 (*)    MCHC 29.7 (*)    All other components within normal limits  COMPREHENSIVE METABOLIC PANEL - Abnormal; Notable for the following components:   Glucose, Bld 143 (*)    Calcium 8.6 (*)    Total Protein 5.8 (*)    Albumin 2.9 (*)    All other components within normal limits  I-STAT CHEM 8, ED - Abnormal; Notable for the following components:   Glucose, Bld 126 (*)    Hemoglobin 15.3 (*)    All other components within normal limits  CBG MONITORING, ED - Abnormal; Notable for the following components:   Glucose-Capillary 128 (*)    All other components within normal limits  RESP PANEL BY RT-PCR (FLU A&B, COVID) ARPGX2  CULTURE, BLOOD (ROUTINE X 2)  CULTURE, BLOOD (ROUTINE X 2)  ETHANOL  PROTIME-INR  APTT  DIFFERENTIAL  RAPID URINE DRUG SCREEN, HOSP PERFORMED  URINALYSIS, ROUTINE W REFLEX MICROSCOPIC  LACTIC ACID, PLASMA  TROPONIN I (HIGH SENSITIVITY)  TROPONIN I (HIGH SENSITIVITY)    EKG EKG Interpretation  Date/Time:  Friday June 25 2020 08:43:05 EST Ventricular  Rate:  64 PR Interval:    QRS Duration: 95 QT Interval:  430 QTC Calculation: 444 R Axis:   30 Text Interpretation: Sinus rhythm Abnormal R-wave progression, early transition No STEMI Confirmed by Alvester Chou [phone]) on 06/25/2020 10:19:12 AM   Radiology MR Brain W and Wo Contrast  Result Date: 06/25/2020 CLINICAL DATA:  Dizziness. EXAM: MRI HEAD WITHOUT AND WITH CONTRAST TECHNIQUE: Multiplanar, multiecho pulse sequences of the brain and surrounding structures were obtained without and with intravenous contrast. CONTRAST:  6.89mL GADAVIST GADOBUTROL 1 MMOL/ML IV SOLN COMPARISON:  MRI 06/27/2011. FINDINGS: Brain: No acute infarction, hemorrhage, hydrocephalus, extra-axial collection or mass lesion. Scattered T2/FLAIR hyperintensities within the white matter, many of which are oriented perpendicular to the long axis of the lateral ventricles. There are new white matter lesions bilaterally when comparing to remote prior MRI from 2012. Vascular: Major arterial flow voids. Skull and upper cervical spine: Normal marrow signal. Sinuses/Orbits: Sinuses are clear.  Unremarkable orbits. Other: Motion limited exam. IMPRESSION: 1. No evidence of acute intracranial abnormality.  No acute infarct. 2. Scattered T2/FLAIR hyperintensities with the white matter, which are consistent with the reported history of multiple sclerosis. There are new supratentorial lesions bilaterally when comparing to remote prior from 20/12, compatible with progression of disease. No abnormal enhancement to suggest active demyelination. Electronically Signed   By: Feliberto Harts MD   On: 06/25/2020 11:54   CT HEAD CODE STROKE WO CONTRAST  Result Date: 06/25/2020 CLINICAL DATA:  Code stroke.  Acute stroke suspected.  Dizziness. EXAM: CT HEAD WITHOUT CONTRAST TECHNIQUE: Contiguous axial images were obtained from the base of the skull through the vertex without intravenous contrast. COMPARISON:  None. FINDINGS: Brain: No evidence of  acute i large vascular territory nfarction, hemorrhage, hydrocephalus, extra-axial collection or mass lesion/mass effect. Vascular: No hyperdense vessel or unexpected calcification. Calcific atherosclerosis Skull: No acute fracture. Sinuses/Orbits: Mild ethmoid air cell mucosal thickening. Unremarkable orbits. Other: No mastoid effusions. ASPECTS [facility] Stroke Program Early CT Score) total score (0-10 with 10 being normal): 10 IMPRESSION: 1. No evidence of acute intracranial abnormality. 2. ASPECTS is 10. Code stroke imaging results were communicated on 06/25/2020 at 9:18 am to provider Dr. Amada Jupiter via telephone, who verbally acknowledged these results. Electronically Signed   By: Feliberto Harts MD   On: 06/25/2020 09:20    Procedures .Critical Care Performed by: Arthor Captain, PA-C Authorized by: Arthor Captain, PA-C   Critical care provider statement:    Critical care time (minutes):  45   Critical care time was exclusive of:  Separately billable procedures and treating other patients   Critical care was necessary to treat or prevent imminent or life-threatening deterioration of the following conditions:  CNS failure or compromise (code stroke, consideration for TPA or Intervention)   Critical care was time spent personally by me on the following activities:  Discussions with consultants, evaluation of patient's response to treatment, examination of patient, ordering and performing treatments and interventions, ordering and review of laboratory studies, ordering and review of radiographic studies, pulse oximetry, re-evaluation of patient's condition, obtaining history from patient or surrogate and review of old charts   Care discussed with: admitting provider     (including critical care time)  Medications Ordered in ED Medications  LORazepam (ATIVAN) injection 1 mg (has no administration in time range)  gadobutrol (GADAVIST) 1 MMOL/ML injection 6.5 mL (6.5 mLs Intravenous Contrast  Given 06/25/20 1121)    ED Course  I have reviewed the triage vital signs and the nursing notes.  Pertinent labs & imaging results that were available during my care of the patient were reviewed by me and considered in my medical decision making (see chart for details).  Clinical Course as of 06/25/20 1250  Fri Jun 25, 2020  0904 69 yo female who is hard of hearing, has advanced MS, wheelchair bound, presenting to the ED with acute onset of vertigo and diaphoresis.  She was on the toilet 1 hour PTA and her son says "she slumped over," and  was reporting severe vertigo afterwards.  EMS reports she had significant diaphoresis en route to the hospital.  The patient cannot provide much history as there are significant language barriers (she typically read lips but is too nauseated to open her eyes).  Minimal strength of the upper and lower extremities at baseline, arms contracted on exam, she cannot adequately test for strength.  No gaze deviation on exam, and she does have unilateral nystagmus with rightward gaze.  She will not verbalize.  Given our limited ability to perform a reliable neurological exam, and her onset of symptoms within a window of intervention, we've activated a code stroke.  Labs and CT imaging ordered. [MT]  1014 Temp 78F - suspect this may be environmental as she was sweating in the ambulance, and temperature is quite cold outside.  WBC 5.0 with no left shift on diff - this seems less likely sepsis [MT]    Clinical Course User Index [MT] Terald Sleeper, MD   MDM Rules/Calculators/A&P                           Final Clinical Impression(s) / ED Diagnoses Final diagnoses:  Syncope and collapse  Hypothermia, initial encounter  12:50 PM BP 112/74   Pulse (!) 58   Temp (!) 96.1 F (35.6 C) (Oral)   Resp 15   Wt 67.4 kg   SpO2 99%   BMI 28.08 kg/m    Additional hx obtained by the patient's son and caregiver at bedside. He states that the patient was using the  bathroom and called out to him.  She told him that she felt like is going to pass out and when asked for a cold towel.  He placed it on the back of her neck and then she fully lost consciousness.  He states that she was unconscious for at least 1 minute and then was only moderately conscious coming to after that.  Gets when he called 911.  She was covered in sweat and began vomiting.  He says that she has had a history of racing heart in the past for which she has seen cardiology and worn a 30-day Holter monitor.  When she has had these episode she is never lost consciousness and they have been able to get it to stop by applying a cold towel to the back of her neck. He states that his mother is on carvedilol and is somewhat noncompliant with her medications because she doesn not like to take them, however she has been taking her medications regularly for at least 1 week. She takes her carvedilol at night and has had no recent medications changes.  Patient here with syncope, Vertigo, vomiting. The differential for syncope is extensive and includes, but is not limited to: arrythmia (Vtach, SVT, SSS, sinus arrest, AV block, bradycardia) aortic stenosis, AMI, HOCM, PE, atrial myxoma, pulmonary hypertension, orthostatic hypotension, (hypovolemia, drug effect, GB syndrome, micturition, cough, swall) carotid sinus sensitivity, Seizure, TIA/CVA, hypoglycemia,  Vertigo. Patient was seen as a CODE STROKE with consideration for TPA or intervention as she is in the stroke window, however patient was not recommended for TPA by Dr. Amada Jupiter. He did recommend further evaluation with MRI.  I have reviewed the CT and MRI films which show no acute abnormalities. Patient's work-up included labs which I ordered and reviewed.  CBC without any significant abnormality, CMP shows a glucose of 143 and low protein.  PT/INR APTT ethanol and troponin within normal  limits.  Patient's Covid/flu swab is negative.   Patient's urine is  still currently pending.  And I have ordered an in and out catheter.  Patient notably hypothermic.  I suspect this is environmental due to the patient's diaphoresis.  She is placed on Bair hugger and her temperature is still at about 96.  She does NOT fit criteria for SIRS, however I have ordered blood culture and lactic acid.  Patient seen in shared visit with Dr. Renaye Rakers. Case discussed with Dr. Ophelia Charter who will admit the patient.   Rx / DC Orders ED Discharge Orders    None       Arthor Captain, PA-C 06/25/20 1250    Terald Sleeper, MD 06/25/20 1302

## 2020-06-25 NOTE — ED Notes (Signed)
Dinner Tray Ordered @ 1744. 

## 2020-06-25 NOTE — ED Notes (Signed)
Pt. Transported to MRI 

## 2020-06-25 NOTE — ED Notes (Signed)
Patient transported to Ct. At this time. 

## 2020-06-25 NOTE — Progress Notes (Signed)
  Echocardiogram 2D Echocardiogram has been performed.  Delcie Roch 06/25/2020, 4:45 PM

## 2020-06-25 NOTE — ED Notes (Signed)
Bear hugger placed on pt due to low temp. Pt also having another episode of vomiting. Will continue to monitor.

## 2020-06-25 NOTE — Code Documentation (Addendum)
Pt BIB [facility] at 0830 after period of dizziness and emesis on toilet this morning upon awakening. Pt has baseline weakness from MS and is very HOH. Code stroke was called at 0855.Blood drawn and CBG 128. Pt cleared by EDP  and taken to CT at 0905.Pt weight 67.4kg. CT negative for acute intracranial process per neurologist. Upon further examination of pt, it was determined that her last known well was last night, and she is back to her baseline. NIHSS 10 given for no effort against gravity in both arms and some effort against gravity in both legs.Pt not eligible for thrombolysis as LKW last night. Pt not eligible for thrombectomy as clinical exam negative for LVO. Pt will need q 2 hr VS and mNIHSS for the next 12 hours then q 4 while workup continues. Bedside handoff with Scientist, forensic.
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
## 2020-06-25 NOTE — ED Triage Notes (Signed)
Pt Danielle Mccarthy from home c/o of weakness and dizziness. Pt has a hx of MS and her son is a primary care giver. Pt was sitting on the [facility] when she became diaphoretic and c/o of weakness and dizziness. Pt denies any pain at this time. Pt states she did vomit twice.

## 2020-06-25 NOTE — ED Notes (Signed)
Patient assisted with drinking PO fluids. Tolerated well. Lights adjusted. Pt denies further needs at this time.

## 2020-06-25 NOTE — Consult Note (Signed)
Neurology Consultation Reason for Consult: Dizziness Referring Physician: Tiburcio Pea, a  CC: Dizziness  History is obtained from: Patient  HPI: Danielle Mccarthy is a 69 y.o. female with a history of multiple sclerosis who has a pretty significant spastic quadriparesis at baseline was able to get to the bedside commode this morning, however while she was seen on the bedside commode she became quite dizzy.  By dizziness, she describes a sensation like she is about to pass out, and she saw some dark splotches in her vision.  She then began to vomit and therefore her son called EMS.  On evaluation here, she still complained of some dizziness and therefore code stroke was activated.  On my evaluation, she describes that she is feeling much better, essentially back to her normal self at this time.  She was dizzy the first time she tried to become upright after waking up, and therefore I think her last known well was actually last night.   LKW: 12/9 prior to bed tpa given?: no, outside window    ROS: A 14 point ROS was performed and is negative except as noted in the HPI.   Past Medical History:  Diagnosis Date  . Breast mass    mass left breast  . Hypertension   . Multiple sclerosis ([facility])   . Vision abnormalities      Family History  Problem Relation Age of Onset  . Hypertension Brother   . Heart failure Mother   . Kidney failure Mother   . Other Father 21       car accident     Social History:  reports that she has never smoked. She has never used smokeless tobacco. She reports that she does not drink alcohol and does not use drugs.   Exam: Current vital signs: BP (!) 142/112   Pulse 71   Temp (!) 95.2 F (35.1 C) (Rectal)   Resp 16   Wt 67.4 kg   SpO2 98%   BMI 28.08 kg/m  Vital signs in last 24 hours: Temp:  [95.2 F (35.1 C)-96.1 F (35.6 C)] 95.2 F (35.1 C) (12/10 0932) Pulse Rate:  [51-71] 71 (12/10 0930) Resp:  [16-23] 16 (12/10 0930) BP:  (111-144)/(76-112) 142/112 (12/10 0930) SpO2:  [98 %-100 %] 98 % (12/10 0930) Weight:  [67.4 kg] 67.4 kg (12/10 0900)   Physical Exam  Constitutional: Appears well-developed and well-nourished.  Psych: Affect appropriate to situation Eyes: No scleral injection HENT: No OP obstrucion MSK: no joint deformities.  Cardiovascular: Normal rate and regular rhythm.  Respiratory: Effort normal, non-labored breathing GI: Soft.  No distension. There is no tenderness.  Skin: WDI  Neuro: Mental Status: Patient is awake, alert, oriented to person, place, month, year, and situation. Patient is able to give a clear and coherent history. No signs of aphasia or neglect Cranial Nerves: II: Visual Fields are full. Pupils are equal, round, and reactive to light.   III,IV, VI: EOMI without ptosis or diploplia.  V: Facial sensation is symmetric to temperature VII: Facial movement is symmetric.  VIII: hearing is intact to voice X: Uvula elevates symmetrically XI: Shoulder shrug is symmetric. XII: tongue is midline without atrophy or fasciculations.  Motor: She has significant spastic quadriparesis, extremely slowly she is able to lift her left arm against gravity, unable to do so on the right, unable to lift either leg against gravity, but she is able to wiggle her toes more briskly on the left than the right Sensory: Sensation is  symmetric to light touch Deep Tendon Reflexes: 2+ and symmetric in the biceps and patellae.  Cerebellar: No clear ataxia in the left arm, otherwise unable to perform   I have reviewed labs in epic and the results pertinent to this consultation are: CMP-unremarkable  I have reviewed the images obtained: CT head-unremarkable  Impression: 69 year old female with acute episode of lightheadedness after getting up from laying down.  The persistence of her symptoms after being prone is unusual, but I do wonder if she had some vagal response to her nausea causing persistence  of symptoms.  I do think it is reasonable to do an MRI, but if she has no enhancing lesions or stroke, then I would treat this as presyncope rather than a neurological cause of her symptoms.  Recommendations: 1) MRI brain with and without contrast 2) further recommendations only if MRI is positive.   Ritta Slot, MD [facility] [phone]  If 7pm- 7am, please page neurology on call as listed in AMION.
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
## 2020-06-26 DIAGNOSIS — G8250 Quadriplegia, unspecified: Secondary | ICD-10-CM | POA: Diagnosis not present

## 2020-06-26 DIAGNOSIS — G35 Multiple sclerosis: Secondary | ICD-10-CM | POA: Diagnosis not present

## 2020-06-26 DIAGNOSIS — R55 Syncope and collapse: Secondary | ICD-10-CM

## 2020-06-26 LAB — BASIC METABOLIC PANEL
Anion gap: 9 (ref 5–15)
BUN: 12 mg/dL (ref 8–23)
CO2: 25 mmol/L (ref 22–32)
Calcium: 8.6 mg/dL — ABNORMAL LOW (ref 8.9–10.3)
Chloride: 107 mmol/L (ref 98–111)
Creatinine, Ser: 0.64 mg/dL (ref 0.44–1.00)
GFR, Estimated: >60 mL/min (ref >60)
Glucose, Bld: 84 mg/dL (ref 70–99)
Potassium: 3.7 mmol/L (ref 3.5–5.1)
Sodium: 141 mmol/L (ref 135–145)

## 2020-06-26 LAB — CBC
HCT: 42 % (ref 36.0–46.0)
Hemoglobin: 12.6 g/dL (ref 12.0–15.0)
MCH: 25.9 pg — ABNORMAL LOW (ref 26.0–34.0)
MCHC: 30 g/dL (ref 30.0–36.0)
MCV: 86.2 fL (ref 80.0–100.0)
Platelets: 203 10*3/uL (ref 150–400)
RBC: 4.87 MIL/uL (ref 3.87–5.11)
RDW: 14.7 % (ref 11.5–15.5)
WBC: 6.5 10*3/uL (ref 4.0–10.5)
nRBC: 0 % (ref 0.0–0.2)

## 2020-06-26 MED ORDER — POLYETHYLENE GLYCOL 3350 17 G PO PACK: 17.0000 g | PACK | Freq: Every day | ORAL | Status: DC | PRN

## 2020-06-26 NOTE — ED Notes (Signed)
Patient verbalizes understanding of discharge instructions. Opportunity for questioning and answers were provided. Pt holding in the ED for PTAR ride home.

## 2020-06-26 NOTE — ED Notes (Signed)
Assisted pt with lunch

## 2020-06-26 NOTE — ED Notes (Signed)
Assisted patient with PO fluids, tolerated well. Denies further needs.

## 2020-06-26 NOTE — Discharge Summary (Signed)
Physician Discharge Summary  Danielle Mccarthy ZOX:[id number] DOB: June 11, 1951 DOA: 06/25/2020  PCP: Leilani Able, MD  Admit date: 06/25/2020 Discharge date: 06/26/2020  Recommendations for Outpatient Follow-up:  1. Routine care   Follow-up Information    Leilani Able, MD Follow up.   Specialty: Family Medicine Why: As needed Contact information: [address] [place] [address] [phone]                Discharge Diagnoses: Principal diagnosis is #1 Principal Problem:   Syncope Active Problems:   Multiple sclerosis (HCC)   Spastic quadriparesis (HCC)   Discharge Condition: improved Disposition: return home with son  Diet recommendation:  Diet Orders (From admission, onward)    Start     Ordered   06/26/20 0000  Diet - low sodium heart healthy        06/26/20 1236   06/25/20 1422  Diet Heart Room service appropriate? Yes; Fluid consistency: Thin  Diet effective now       Question Answer Comment  Room service appropriate? Yes   Fluid consistency: Thin      06/25/20 1423           Filed Weights   06/25/20 0900  Weight: 67.4 kg    HPI/Hospital Course:   69 year old woman PMH multiple sclerosis with spastic quadriparesis, SVT presented to the emergency department after syncopal episode with visual changes, diaphoresis, feeling hot while on the bedside commode.  Upon awakening vomited clear liquid.  Code stroke was activated, seen by neurology.  Code stroke was canceled, not a candidate for TPA as presented outside window.  Noted to be hypothermic, thought environmental in nature.  Admitted for syncopal evaluation.  Work-ups including imaging of the head, laboratory work-up and cardiac work-up were unrevealing and reassuring.  Most likely vasovagal event.  Syncope, following large BM while on the commode with symptoms typical of vasovagal phenomenon. 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  TSH within normal limits.  CBC and chemistry panel unremarkable.  Chest x-ray atelectasis.  Troponins negative.  Echocardiogram, showed hyperdynamic LV LVEF 70-75%.  No regional wall motion abnormalities.  Grade 1 diastolic dysfunction.  Normal RV systolic function.  No significant valvular abnormalities noted.  IVC normal in size.  Multiple sclerosis with associated spastic quadriparesis --Not currently on disease modifying medications.  MRI brain without evidence of acute exacerbation.  Neurology signed off, no further recommendations. --Continue baclofen  PMH SVT?  Patient denies.  Reportedly treated as an outpatient with vagal maneuvers. --Telemetry SR. No arrhythmias.  --On carvedilol but has only been taking once daily --Follow-up as an outpatient as needed  Today's assessment: S: CC: f/u syncope Feels back to normal, no complaints, really does not remember events from yesterday.  Denies previous syncopal episodes.  Does admit she does not drink enough water, as she needs assistance for toileting and does not wish to overtax her son.  O: Vitals:  Vitals:   06/26/20 1030 06/26/20 1115  BP: 131/79 (!) 146/71  Pulse: 65 65  Resp: 12 18  Temp:    SpO2: 97% 98%    Constitutional:   Appears calm and comfortable ENMT:   Able to read lips quite easily and communicate quite clearly Respiratory:   CTA bilaterally, no w/r/r.   Respiratory effort normal. Cardiovascular:   RRR, no m/r/g  No significant LE extremity edema  Telemetry SR Abdomen:   Soft, ntnd Musculoskeletal:   BUE contractures Skin:   Well-healed pretibia wounds Psychiatric:   judgement and insight appear normal  Mental status o Mood, affect appropriate  Basic metabolic panel, CBC, TSH unremarkable, troponins within normal limits, urinalysis unremarkable.  Discharge  Instructions  Discharge Instructions    Diet - low sodium heart healthy   Complete by: As directed    Discharge instructions   Complete by: As directed    Call your physician or seek immediate medical attention for passing out, confusion, vomiting, fever or worsening of condition. Drink plenty of water. Supervision while on commode next 24 hours. Activity as usual at home.     Allergies as of 06/26/2020   No Known Allergies     Medication List    TAKE these medications   baclofen 10 MG tablet Commonly known as: LIORESAL Take 0.5 tablets (5 mg total) by mouth 3 (three) times daily.   carvedilol 25 MG tablet Commonly known as: COREG Take 25 mg by mouth.   polyethylene glycol 17 g packet Commonly known as: MIRALAX / GLYCOLAX Take 17 g by mouth daily as needed for mild constipation.      No Known Allergies  The results of significant diagnostics from this hospitalization (including imaging, microbiology, ancillary and laboratory) are listed below for reference.    Significant Diagnostic Studies: MR Brain W and Wo Contrast  Result Date: 06/25/2020 CLINICAL DATA:  Dizziness. EXAM: MRI HEAD WITHOUT AND WITH CONTRAST TECHNIQUE: Multiplanar, multiecho pulse sequences of the brain and surrounding structures were obtained without and with intravenous contrast. CONTRAST:  6.58mL GADAVIST GADOBUTROL 1 MMOL/ML IV SOLN COMPARISON:  MRI 06/27/2011. FINDINGS: Brain: No acute infarction, hemorrhage, hydrocephalus, extra-axial collection or mass lesion. Scattered T2/FLAIR hyperintensities within the white matter, many of which are oriented perpendicular to the long axis of the lateral ventricles. There are new white matter lesions bilaterally when comparing to remote prior MRI from 2012. Vascular: Major arterial flow voids. Skull and upper cervical spine: Normal marrow signal. Sinuses/Orbits: Sinuses are clear.  Unremarkable orbits. Other: Motion limited exam. IMPRESSION: 1. No evidence of acute  intracranial abnormality.  No acute infarct. 2. Scattered T2/FLAIR hyperintensities with the white matter, which are consistent with the reported history of multiple sclerosis. There are new supratentorial lesions bilaterally when comparing to remote prior from 20/12, compatible with progression of disease. No abnormal enhancement to suggest active demyelination. Electronically Signed   By: Feliberto Harts MD   On: 06/25/2020 11:54   DG Chest Port 1 View  Result Date: 06/25/2020 CLINICAL DATA:  Syncope EXAM: PORTABLE CHEST 1 VIEW COMPARISON:  04/08/2017 and prior FINDINGS: Hypoinflated lungs. Patchy perihilar and left basilar opacities. No pneumothorax or pleural effusion. Stable cardiomediastinal silhouette. Multilevel spondylosis. IMPRESSION: Perihilar and left basilar opacities, atelectasis versus infiltrate. Electronically Signed   By: Stana Bunting M.D.   On: 06/25/2020 13:10   ECHOCARDIOGRAM COMPLETE  Result Date: 06/25/2020    ECHOCARDIOGRAM REPORT   Patient Name:   Danielle Mccarthy Date of Exam: 06/25/2020 Medical Rec #:  [id number]                 Height:       61.0 in Accession #:    [id number]                Weight:       148.6 lb Date of Birth:  1951-07-14  BSA:          1.665 m Patient Age:    69 years                  BP:           120/62 mmHg Patient Gender: F                         HR:           63 bpm. Exam Location:  Inpatient Procedure: 2D Echo Indications:    syncope 780.2  History:        Patient has prior history of Echocardiogram examinations, most                 recent 07/31/2017. Multiple sclerosis, Arrythmias:SVT,                 Signs/Symptoms:Syncope; Risk Factors:Hypertension.  Sonographer:    Delcie Roch Referring Phys: [id number] JENNIFER YATES IMPRESSIONS  1. Left ventricular ejection fraction, by estimation, is 70 to 75%. The left ventricle has hyperdynamic function. The left ventricle has no regional wall motion abnormalities. Left  ventricular diastolic parameters are consistent with Grade I diastolic dysfunction (impaired relaxation).  2. Right ventricular systolic function is normal. The right ventricular size is normal. There is normal pulmonary artery systolic pressure.  3. The mitral valve is normal in structure. No evidence of mitral valve regurgitation. No evidence of mitral stenosis.  4. Tricuspid valve regurgitation is moderate.  5. The aortic valve is normal in structure. Aortic valve regurgitation is not visualized. No aortic stenosis is present.  6. The inferior vena cava is normal in size with greater than 50% respiratory variability, suggesting right atrial pressure of 3 mmHg. FINDINGS  Left Ventricle: Left ventricular ejection fraction, by estimation, is 70 to 75%. The left ventricle has hyperdynamic function. The left ventricle has no regional wall motion abnormalities. The left ventricular internal cavity size was normal in size. There is no left ventricular hypertrophy. Left ventricular diastolic parameters are consistent with Grade I diastolic dysfunction (impaired relaxation). Right Ventricle: The right ventricular size is normal. No increase in right ventricular wall thickness. Right ventricular systolic function is normal. There is normal pulmonary artery systolic pressure. The tricuspid regurgitant velocity is 2.68 m/s, and  with an assumed right atrial pressure of 3 mmHg, the estimated right ventricular systolic pressure is 31.7 mmHg. Left Atrium: Left atrial size was normal in size. Right Atrium: Right atrial size was normal in size. Pericardium: There is no evidence of pericardial effusion. Mitral Valve: The mitral valve is normal in structure. No evidence of mitral valve regurgitation. No evidence of mitral valve stenosis. Tricuspid Valve: The tricuspid valve is normal in structure. Tricuspid valve regurgitation is moderate . No evidence of tricuspid stenosis. Aortic Valve: The aortic valve is normal in structure.  Aortic valve regurgitation is not visualized. No aortic stenosis is present. Pulmonic Valve: The pulmonic valve was normal in structure. Pulmonic valve regurgitation is trivial. No evidence of pulmonic stenosis. Aorta: The aortic root is normal in size and structure. Venous: The inferior vena cava is normal in size with greater than 50% respiratory variability, suggesting right atrial pressure of 3 mmHg. IAS/Shunts: No atrial level shunt detected by color flow Doppler.  LEFT VENTRICLE PLAX 2D LVIDd:         4.40 cm  Diastology LVIDs:         2.80 cm  LV e' medial:  6.09 cm/s LV PW:         1.00 cm  LV E/e' medial:  11.1 LV IVS:        0.90 cm  LV e' lateral:   8.16 cm/s LVOT diam:     1.80 cm  LV E/e' lateral: 8.3 LV SV:         83 LV SV Index:   50 LVOT Area:     2.54 cm  RIGHT VENTRICLE             IVC RV S prime:     12.30 cm/s  IVC diam: 1.00 cm LEFT ATRIUM           Index       RIGHT ATRIUM           Index LA diam:      3.30 cm 1.98 cm/m  RA Area:     11.10 cm LA Vol (A4C): 35.0 ml 21.02 ml/m RA Volume:   23.30 ml  14.00 ml/m  AORTIC VALVE LVOT Vmax:   141.00 cm/s LVOT Vmean:  102.000 cm/s LVOT VTI:    0.328 m  AORTA Ao Root diam: 3.00 cm Ao Asc diam:  3.00 cm MITRAL VALVE               TRICUSPID VALVE MV Area (PHT): 2.45 cm    TR Peak grad:   28.7 mmHg MV Decel Time: 310 msec    TR Vmax:        268.00 cm/s MV E velocity: 67.60 cm/s MV A velocity: 83.80 cm/s  SHUNTS MV E/A ratio:  0.81        Systemic VTI:  0.33 m                            Systemic Diam: 1.80 cm Mark Skains MD ElectronDonato Schultzigned by Donato Schultz MD Signature Date/Time: 06/25/2020/4:54:11 PM    Final    CT HEAD CODE STROKE WO CONTRAST  Result Date: 06/25/2020 CLINICAL DATA:  Code stroke.  Acute stroke suspected.  Dizziness. EXAM: CT HEAD WITHOUT CONTRAST TECHNIQUE: Contiguous axial images were obtained from the base of the skull through the vertex without intravenous contrast. COMPARISON:  None. FINDINGS: Brain: No evidence of  acute i large vascular territory nfarction, hemorrhage, hydrocephalus, extra-axial collection or mass lesion/mass effect. Vascular: No hyperdense vessel or unexpected calcification. Calcific atherosclerosis Skull: No acute fracture. Sinuses/Orbits: Mild ethmoid air cell mucosal thickening. Unremarkable orbits. Other: No mastoid effusions. ASPECTS [facility] Stroke Program Early CT Score) total score (0-10 with 10 being normal): 10 IMPRESSION: 1. No evidence of acute intracranial abnormality. 2. ASPECTS is 10. Code stroke imaging results were communicated on 06/25/2020 at 9:18 am to provider Dr. Amada Jupiter via telephone, who verbally acknowledged these results. Electronically Signed   By: Feliberto Harts MD   On: 06/25/2020 09:20    Microbiology: Recent Results (from the past 240 hour(s))  Resp Panel by RT-PCR (Flu A&B, Covid) Nasopharyngeal Swab     Status: None   Collection Time: 06/25/20  9:22 AM   Specimen: Nasopharyngeal Swab; Nasopharyngeal(NP) swabs in vial transport medium  Result Value Ref Range Status   SARS Coronavirus 2 by RT PCR NEGATIVE NEGATIVE Final    Comment: (NOTE) SARS-CoV-2 target nucleic acids are NOT DETECTED.  The SARS-CoV-2 RNA is generally detectable in upper respiratory specimens during the acute phase of infection. The lowest concentration of SARS-CoV-2 viral copies this assay can detect  is 138 copies/mL. A negative result does not preclude SARS-Cov-2 infection and should not be used as the sole basis for treatment or other patient management decisions. A negative result may occur with  improper specimen collection/handling, submission of specimen other than nasopharyngeal swab, presence of viral mutation(s) within the areas targeted by this assay, and inadequate number of viral copies(<138 copies/mL). A negative result must be combined with clinical observations, patient history, and epidemiological information. The expected result is Negative.  Fact Sheet for  Patients:  BloggerCourse.com  Fact Sheet for Healthcare Providers:  SeriousBroker.it  This test is no t yet approved or cleared by the [place] FDA and  has been authorized for detection and/or diagnosis of SARS-CoV-2 by FDA under an Emergency Use Authorization (EUA). This EUA will remain  in effect (meaning this test can be used) for the duration of the COVID-19 declaration under Section 564(b)(1) of the Act, 21 U.S.C.section 360bbb-3(b)(1), unless the authorization is terminated  or revoked sooner.       Influenza A by PCR NEGATIVE NEGATIVE Final   Influenza B by PCR NEGATIVE NEGATIVE Final    Comment: (NOTE) The Xpert Xpress SARS-CoV-2/FLU/RSV plus assay is intended as an aid in the diagnosis of influenza from Nasopharyngeal swab specimens and should not be used as a sole basis for treatment. Nasal washings and aspirates are unacceptable for Xpert Xpress SARS-CoV-2/FLU/RSV testing.  Fact Sheet for Patients: BloggerCourse.com  Fact Sheet for Healthcare Providers: SeriousBroker.it  This test is not yet approved or cleared by the [place] FDA and has been authorized for detection and/or diagnosis of SARS-CoV-2 by FDA under an Emergency Use Authorization (EUA). This EUA will remain in effect (meaning this test can be used) for the duration of the COVID-19 declaration under Section 564(b)(1) of the Act, 21 U.S.C. section 360bbb-3(b)(1), unless the authorization is terminated or revoked.  Performed at [facility], [address], [place], [place] [address]   Blood culture (routine x 2)     Status: None (Preliminary result)   Collection Time: 06/25/20  1:58 PM   Specimen: BLOOD  Result Value Ref Range Status   Specimen Description BLOOD SITE NOT SPECIFIED  Final   Special Requests   Final    BOTTLES DRAWN AEROBIC AND ANAEROBIC Blood Culture results may not be  optimal due to an inadequate volume of blood received in culture bottles   Culture   Final    NO GROWTH < 24 HOURS Performed at [facility], [address], [place], [place] [address]    Report Status PENDING  Incomplete  Blood culture (routine x 2)     Status: None (Preliminary result)   Collection Time: 06/25/20  2:12 PM   Specimen: BLOOD  Result Value Ref Range Status   Specimen Description BLOOD SITE NOT SPECIFIED  Final   Special Requests   Final    BOTTLES DRAWN AEROBIC AND ANAEROBIC Blood Culture adequate volume   Culture   Final    NO GROWTH < 24 HOURS Performed at [facility], [address], [place], [place] [address]    Report Status PENDING  Incomplete     Labs: Basic Metabolic Panel: Recent Labs  Lab 06/25/20 0848 06/25/20 0903 06/26/20 0530  NA 139 142 141  K 5.1 3.9 3.7  CL 106 109 107  CO2 24  --  25  GLUCOSE 143* 126* 84  BUN 16 18 12   CREATININE 0.80 0.70 0.64  CALCIUM 8.6*  --  8.6*   Liver Function Tests: Recent Labs  Lab 06/25/20 0848  AST 30  ALT 12  ALKPHOS 62  BILITOT 0.8  PROT 5.8*  ALBUMIN 2.9*   CBC: Recent Labs  Lab 06/25/20 0848 06/25/20 0903 06/26/20 0530  WBC 5.0  --  6.5  NEUTROABS 2.4  --   --   HGB 12.3 15.3* 12.6  HCT 41.4 45.0 42.0  MCV 86.8  --  86.2  PLT 191  --  203   CBG: Recent Labs  Lab 06/25/20 0903  GLUCAP 128*    Principal Problem:   Syncope Active Problems:   Multiple sclerosis (HCC)   Spastic quadriparesis (HCC)   Time coordinating discharge: 45 minutes  Signed:  Brendia Sacks, MD  [facility]  06/26/2020, 12:37 PM

## 2020-06-26 NOTE — ED Notes (Signed)
Patient verbalizes understanding of discharge instructions. Opportunity for questioning and answers were provided. Armband removed by staff, pt discharged from ED in [facility] via [facility]

## 2020-06-26 NOTE — ED Notes (Addendum)
Attempted to drawn 0500 labs from IV, unable to obtain same.

## 2020-06-26 NOTE — Hospital Course (Addendum)
69 year old woman PMH multiple sclerosis with spastic quadriparesis, SVT presented to the emergency department after syncopal episode with visual changes, diaphoresis, feeling hot while on the bedside commode.  Upon awakening vomited clear liquid.  Code stroke was activated, seen by neurology.  Code stroke was canceled, not a candidate for TPA as presented outside window.  Noted to be hypothermic, thought environmental in nature.  Admitted for syncopal evaluation.  Work-ups including imaging of the head, laboratory work-up and cardiac work-up were unrevealing and reassuring.  Most likely vasovagal event.  Syncope, following large BM while on the commode with symptoms typical of vasovagal phenomenon. 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  TSH within normal limits.  CBC and chemistry panel unremarkable.  Chest x-ray atelectasis.  Troponins negative.  Echocardiogram, showed hyperdynamic LV LVEF 70-75%.  No regional wall motion abnormalities.  Grade 1 diastolic dysfunction.  Normal RV systolic function.  No significant valvular abnormalities noted.  IVC normal in size.  Multiple sclerosis with associated spastic quadriparesis --Not currently on disease modifying medications.  MRI brain without evidence of acute exacerbation.  Neurology signed off, no further recommendations. --Continue baclofen  PMH SVT?  Patient denies.  Reportedly treated as an outpatient with vagal maneuvers. --Telemetry SR. No arrhythmias.  --On carvedilol but has only been taking once daily --Follow-up as an outpatient as needed

## 2020-06-26 NOTE — Progress Notes (Signed)
[facility] ([facility])  Hospital Liaison RN note         Notified by TOC manager of patient/family request for [facility] Palliative services at home after discharge.         [facility] Palliative team will follow up with patient after discharge.         Please call with any hospice or palliative related questions.         Thank you for the opportunity to participate in this patient's care.     Chrislyn King, BSN, RN [facility] Hospital Liaison (listed on AMION under Hospice/[facility])    [phone] 

## 2020-06-26 NOTE — ED Notes (Signed)
Called lab to confirm they had tube for BMP.

## 2020-06-26 NOTE — Progress Notes (Signed)
MRI negative for stroke or acute MS exacerbation. Suspect syncope. Would not change any MS management based on this event, agree with cardiac workup.   No further neurological recommendations at this time. Please call with further questinos or concerns.   Ritta Slot, MD [facility] [phone]  If 7pm- 7am, please page neurology on call as listed in AMION.

## 2020-06-26 NOTE — ED Notes (Signed)
Breakfast Ordered 

## 2020-06-26 NOTE — TOC Transition Note (Signed)
Transition of Care [facility]) - CM/SW Discharge Note   Patient Details  Name: Danielle Mccarthy MRN: [id number] Date of Birth: 19-Feb-1951  [facility] of [facility]) CM/SW Contact:  Lockie Pares, RN Phone Number: 06/26/2020, 1:45 PM   Clinical Narrative:    Patient will be discharging, ( bedded still in the ED). Consult placed for post hospitalization Palliative services for patient. Notified Crislyn of [facility] about consult.    Final next level of care: Home/Self Care (palliative consult) Barriers to Discharge: No Barriers Identified   Patient Goals and CMS Choice        Discharge Placement                       Discharge Plan and Services                                     Social Determinants of Health (SDOH) Interventions     Readmission Risk Interventions No flowsheet data found.

## 2020-06-26 NOTE — ED Notes (Signed)
PTAR called by Okey Dupre 15:09

## 2020-06-29 ENCOUNTER — Telehealth: Payer: Self-pay

## 2020-06-29 NOTE — Telephone Encounter (Signed)
Spoke with patient's son Lynelle Smoke and scheduled an in-person Palliative Consult for 07/23/20 @ 1:30 PM   COVID screening was negative. No pets in home. Patient's son lives with her.    Consent obtained; updated Outlook/Netsmart/Team List and Epic.

## 2020-06-30 LAB — CULTURE, BLOOD (ROUTINE X 2)
Culture: NO GROWTH
Culture: NO GROWTH
Special Requests: ADEQUATE

## 2020-07-23 ENCOUNTER — Other Ambulatory Visit: Payer: Medicare Other | Admitting: Internal Medicine

## 2020-07-23 ENCOUNTER — Other Ambulatory Visit: Payer: Self-pay

## 2020-12-06 ENCOUNTER — Other Ambulatory Visit: Payer: Self-pay | Admitting: Family Medicine

## 2020-12-06 DIAGNOSIS — Z1231 Encounter for screening mammogram for malignant neoplasm of breast: Secondary | ICD-10-CM

## 2021-02-02 ENCOUNTER — Other Ambulatory Visit: Payer: Self-pay

## 2021-02-02 ENCOUNTER — Ambulatory Visit
Admission: RE | Admit: 2021-02-02 | Discharge: 2021-02-02 | Disposition: A | Discharge status: Home / Self Care | Payer: Medicare Other | Source: Ambulatory Visit | Attending: Family Medicine | Admitting: Family Medicine

## 2021-02-02 DIAGNOSIS — Z1231 Encounter for screening mammogram for malignant neoplasm of breast: Secondary | ICD-10-CM

## 2021-04-25 ENCOUNTER — Ambulatory Visit: Payer: Medicare Other | Admitting: Neurology

## 2021-08-23 ENCOUNTER — Telehealth: Payer: Self-pay

## 2021-08-23 NOTE — Telephone Encounter (Signed)
(  4:45 pm) PC SW attempted to contact patient's son. SW received no answer.

## 2022-01-04 ENCOUNTER — Encounter: Payer: Self-pay | Admitting: Neurology

## 2022-01-04 ENCOUNTER — Ambulatory Visit (INDEPENDENT_AMBULATORY_CARE_PROVIDER_SITE_OTHER): Payer: Medicare Other | Admitting: Neurology

## 2022-01-04 VITALS — BP 165/87 | HR 74 | Ht 61.0 in

## 2022-01-04 DIAGNOSIS — R262 Difficulty in walking, not elsewhere classified: Secondary | ICD-10-CM

## 2022-01-04 DIAGNOSIS — M7551 Bursitis of right shoulder: Secondary | ICD-10-CM | POA: Diagnosis not present

## 2022-01-04 DIAGNOSIS — G35 Multiple sclerosis: Secondary | ICD-10-CM

## 2022-01-04 DIAGNOSIS — G8250 Quadriplegia, unspecified: Secondary | ICD-10-CM

## 2022-01-04 DIAGNOSIS — H269 Unspecified cataract: Secondary | ICD-10-CM

## 2022-01-04 MED ORDER — BACLOFEN 10 MG PO TABS: ORAL_TABLET | ORAL | 4 refills | Status: DC
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
## 2022-01-04 NOTE — Progress Notes (Signed)
Faxed letter to [facility] per Dr. Epimenio Foot request at [phone]. Received fax confirmation.

## 2022-01-09 ENCOUNTER — Other Ambulatory Visit: Payer: Self-pay | Admitting: Family Medicine

## 2022-01-09 DIAGNOSIS — Z1231 Encounter for screening mammogram for malignant neoplasm of breast: Secondary | ICD-10-CM

## 2022-01-26 ENCOUNTER — Encounter: Payer: Self-pay | Admitting: Neurology

## 2022-02-03 ENCOUNTER — Other Ambulatory Visit: Payer: Self-pay

## 2022-02-03 ENCOUNTER — Ambulatory Visit: Payer: Medicare Other

## 2022-02-03 ENCOUNTER — Emergency Department (HOSPITAL_BASED_OUTPATIENT_CLINIC_OR_DEPARTMENT_OTHER)
Admission: EM | Admit: 2022-02-03 | Discharge: 2022-02-03 | Disposition: A | Discharge status: Home / Self Care | Payer: Medicare Other | Attending: Emergency Medicine | Admitting: Emergency Medicine

## 2022-02-03 ENCOUNTER — Encounter (HOSPITAL_BASED_OUTPATIENT_CLINIC_OR_DEPARTMENT_OTHER): Payer: Self-pay

## 2022-02-03 ENCOUNTER — Emergency Department (HOSPITAL_BASED_OUTPATIENT_CLINIC_OR_DEPARTMENT_OTHER): Payer: Medicare Other | Admitting: Radiology

## 2022-02-03 DIAGNOSIS — M79604 Pain in right leg: Secondary | ICD-10-CM | POA: Insufficient documentation

## 2022-02-03 DIAGNOSIS — Z79899 Other long term (current) drug therapy: Secondary | ICD-10-CM | POA: Insufficient documentation

## 2022-02-03 DIAGNOSIS — M25561 Pain in right knee: Secondary | ICD-10-CM | POA: Insufficient documentation

## 2022-02-03 DIAGNOSIS — I10 Essential (primary) hypertension: Secondary | ICD-10-CM | POA: Diagnosis not present

## 2022-02-03 DIAGNOSIS — W19XXXA Unspecified fall, initial encounter: Secondary | ICD-10-CM

## 2022-02-03 DIAGNOSIS — M25562 Pain in left knee: Secondary | ICD-10-CM | POA: Diagnosis not present

## 2022-02-03 DIAGNOSIS — Y92002 Bathroom of unspecified non-institutional (private) residence single-family (private) house as the place of occurrence of the external cause: Secondary | ICD-10-CM | POA: Insufficient documentation

## 2022-02-03 DIAGNOSIS — W050XXA Fall from non-moving wheelchair, initial encounter: Secondary | ICD-10-CM | POA: Insufficient documentation

## 2022-02-03 DIAGNOSIS — S8010XA Contusion of unspecified lower leg, initial encounter: Secondary | ICD-10-CM

## 2022-02-03 DIAGNOSIS — S8000XA Contusion of unspecified knee, initial encounter: Secondary | ICD-10-CM

## 2022-02-03 MED ORDER — HYDROCODONE-ACETAMINOPHEN 5-325 MG PO TABS: 1.0000 | ORAL_TABLET | Freq: Four times a day (QID) | ORAL | 0 refills | Status: DC | PRN

## 2022-02-03 MED ORDER — HYDROCODONE-ACETAMINOPHEN 5-325 MG PO TABS
2.0000 | ORAL_TABLET | Freq: Once | ORAL | Status: AC
  Administered 2022-02-03: 2 via ORAL
  Filled 2022-02-03: qty 2

## 2022-02-03 NOTE — Discharge Instructions (Addendum)
Ice for 20 minutes every 2 hours while awake for the next 2 days.  Wear Ace bandage for comfort and support.  Take hydrocodone as prescribed as needed for pain.  Follow-up with your primary doctor if not improving in the next week.

## 2022-02-03 NOTE — ED Notes (Signed)
Patient and family verbalizes understanding of discharge instructions. Opportunity for questioning and answers were provided. Patient discharged from ED with [facility].

## 2022-02-03 NOTE — ED Provider Notes (Signed)
  [facility] EMERGENCY DEPT Provider Note   CSN: [id number] Arrival date & time: 02/03/22  0414     History  Chief Complaint  Patient presents with   Knee Pain   Fall    Danielle Mccarthy is a 71 y.o. female.  Patient is a 71 year old female with past medical history of progressive MS who is wheelchair-bound, blindness, and hypertension.  She presents today for evaluation of fall.  Her caregiver was attempting to get her from the toilet into the wheelchair when she lost her balance and twisted her knees awkwardly causing her to fall.  The caregiver lowered her to the ground, however her knees bent awkwardly.  She is having pain in both knees, right greater than left.  She denies other injury.  Pain is worse with attempted movement.  No alleviating factors.  The history is provided by the patient.       Home Medications Prior to Admission medications   Medication Sig Start Date End Date Taking? Authorizing Provider  baclofen (LIORESAL) 10 MG tablet Take 1/2 to 1 pill tid 01/04/22   Sater, Pearletha Furl, MD  carvedilol (COREG) 25 MG tablet Take 25 mg by mouth.    [provider]  furosemide (LASIX) 20 MG tablet Take 20 mg by mouth daily. 10/15/21   [provider]      Allergies    Patient has no known allergies.    Review of Systems   Review of Systems  All other systems reviewed and are negative.   Physical Exam Updated Vital Signs BP (!) 151/71 (BP Location: Left Arm)   Pulse 79   Temp 99 F (37.2 C) (Oral)   Resp 17   Ht 5\' 1"  (1.549 m)   Wt 69.3 kg   SpO2 100%   BMI 28.87 kg/m  Physical Exam Vitals and nursing note reviewed.  Constitutional:      General: She is not in acute distress.    Appearance: Normal appearance. She is not ill-appearing.  HENT:     Head: Normocephalic and atraumatic.  Pulmonary:     Effort: Pulmonary effort is normal.  Musculoskeletal:     Comments: Knees are grossly normal in appearance and  symmetrical.  She does have some pain with range of motion of the right knee, but it does appear stable.  Distal PMS is intact.  Skin:    General: Skin is warm and dry.  Neurological:     Mental Status: She is alert.     ED Results / Procedures / Treatments   Labs (all labs ordered are listed, but only abnormal results are displayed) Labs Reviewed - No data to display  EKG None  Radiology No results found.  Procedures Procedures    Medications Ordered in ED Medications - No data to display  ED Course/ Medical Decision Making/ A&P  Patient presenting here with complaints fall, the details of which are described in the HPI.  Patient complaining of pain in the right leg and bilateral knees.  X-rays of these areas are all negative.  I feel as though patient can be discharged.  To follow-up as needed if not improving.  Final Clinical Impression(s) / ED Diagnoses Final diagnoses:  None    Rx / DC Orders ED Discharge Orders     None         , MD 02/03/22 [phone]

## 2022-02-03 NOTE — ED Triage Notes (Signed)
Pt c/o bilateral knee pain. Pt's son was helping her move over to toilet. Pt's knees gave out causing her to slump to the floor. Fall was broken because son had his arms around patient.

## 2022-02-03 NOTE — ED Notes (Signed)
[facility] called @ [phone].

## 2022-03-15 ENCOUNTER — Ambulatory Visit
Admission: RE | Admit: 2022-03-15 | Discharge: 2022-03-15 | Disposition: A | Discharge status: Home / Self Care | Payer: Medicare Other | Source: Ambulatory Visit | Attending: Family Medicine | Admitting: Family Medicine

## 2022-03-15 DIAGNOSIS — Z1231 Encounter for screening mammogram for malignant neoplasm of breast: Secondary | ICD-10-CM

## 2022-12-07 ENCOUNTER — Telehealth: Payer: Self-pay | Admitting: *Deleted

## 2022-12-07 NOTE — Telephone Encounter (Signed)
Pt [employer] form faxed on 12/07/22 to [phone] copy @ the front desk for p/u

## 2022-12-07 NOTE — Telephone Encounter (Signed)
Gave completed/signed [employer] questionnaire form/transportation back to medical records to process for pt.

## 2023-03-28 ENCOUNTER — Other Ambulatory Visit: Payer: Self-pay | Admitting: Family Medicine

## 2023-03-28 DIAGNOSIS — Z1231 Encounter for screening mammogram for malignant neoplasm of breast: Secondary | ICD-10-CM

## 2023-04-04 LAB — EXTERNAL GENERIC LAB PROCEDURE: COLOGUARD: NEGATIVE

## 2023-04-04 LAB — COLOGUARD: COLOGUARD: NEGATIVE

## 2023-04-26 IMAGING — MG MM DIGITAL SCREENING BILAT W/ TOMO AND CAD
8 series · 8 of 24 positions shown · non-contrast
Comparison: Previous exam(s).

CLINICAL DATA: Screening.

EXAM:
DIGITAL SCREENING BILATERAL MAMMOGRAM WITH TOMOSYNTHESIS AND CAD
TECHNIQUE: Bilateral screening digital craniocaudal and mediolateral oblique
mammograms were obtained. Bilateral screening digital breast
tomosynthesis was performed. The images were evaluated with
computer-aided detection.

[L CC synth-2D]
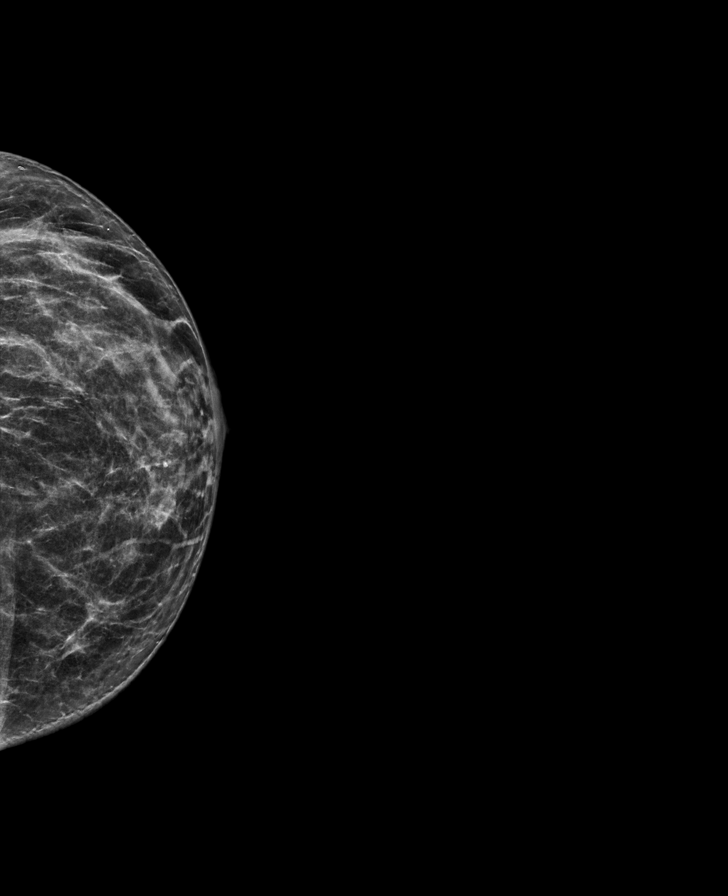

[R MLO synth-2D]
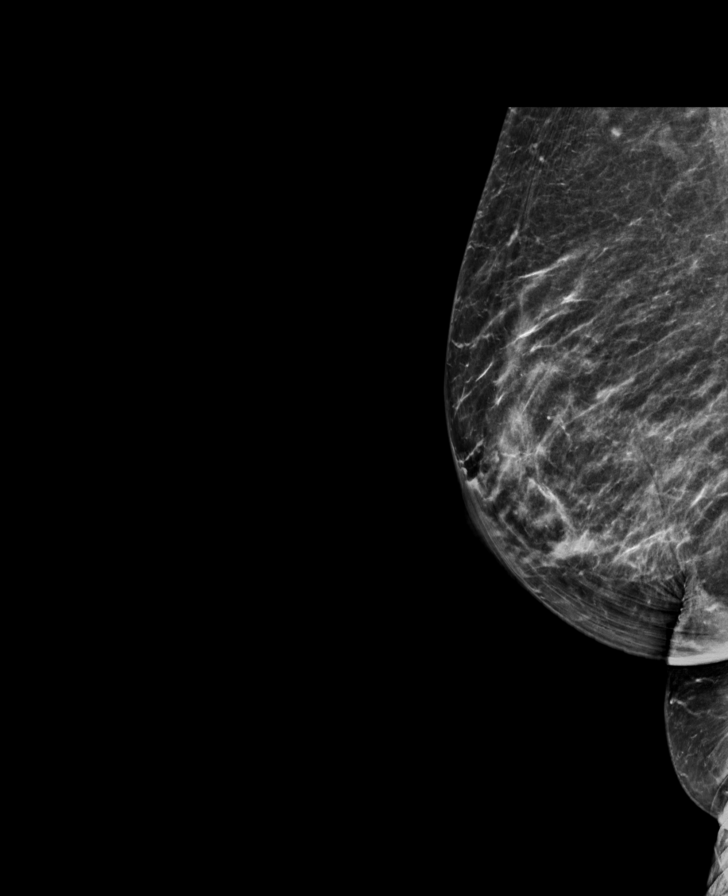

[R CC synth-2D]
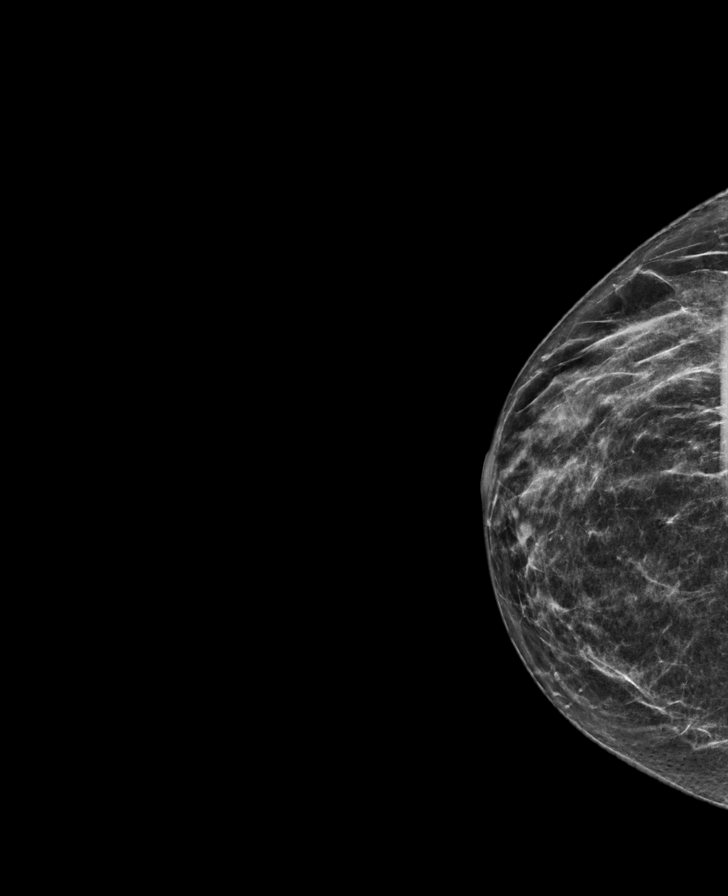

[L MLO synth-2D]
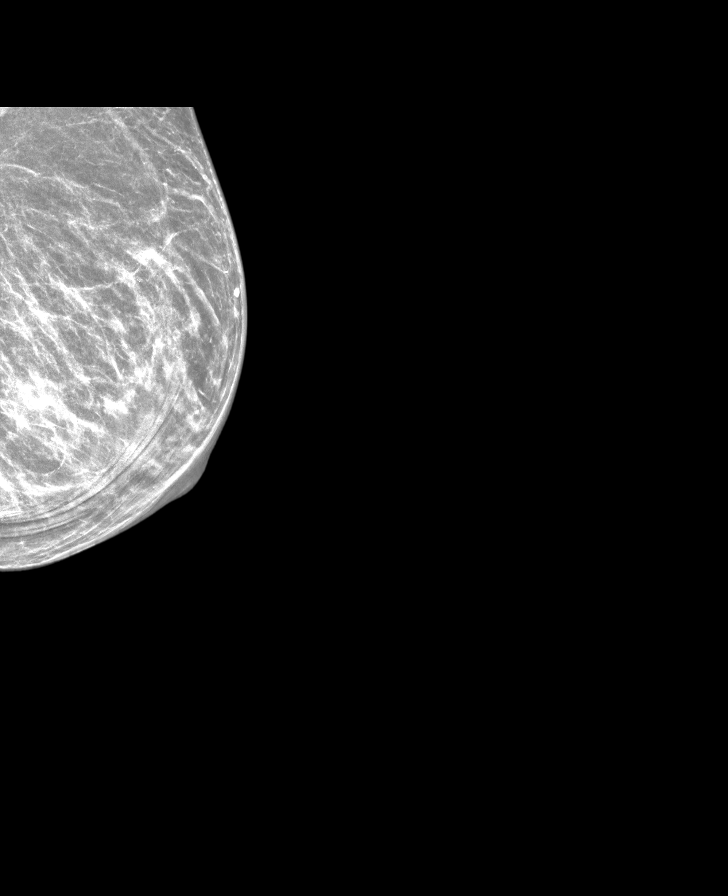

[R MLO tomo · tomo slice 37/72.0]
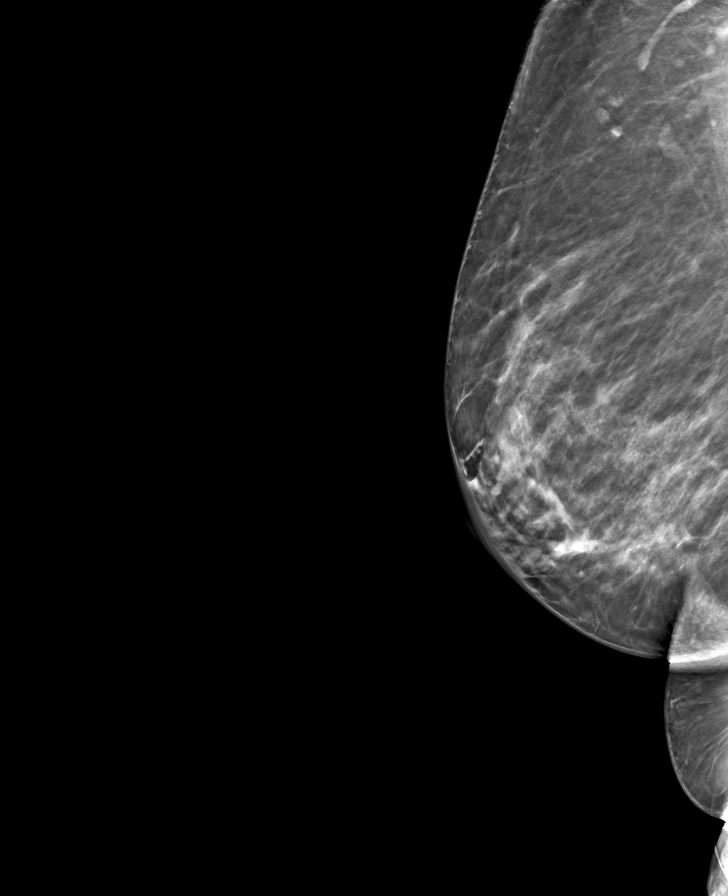

[L MLO tomo · tomo slice 35/68.0]
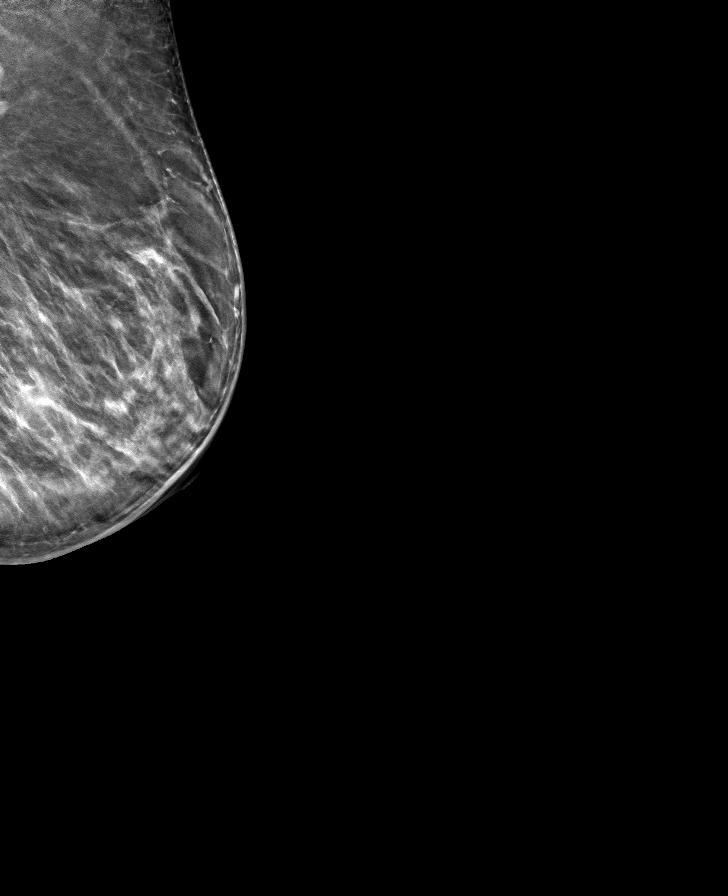

[R CC tomo · tomo slice 31/61.0]
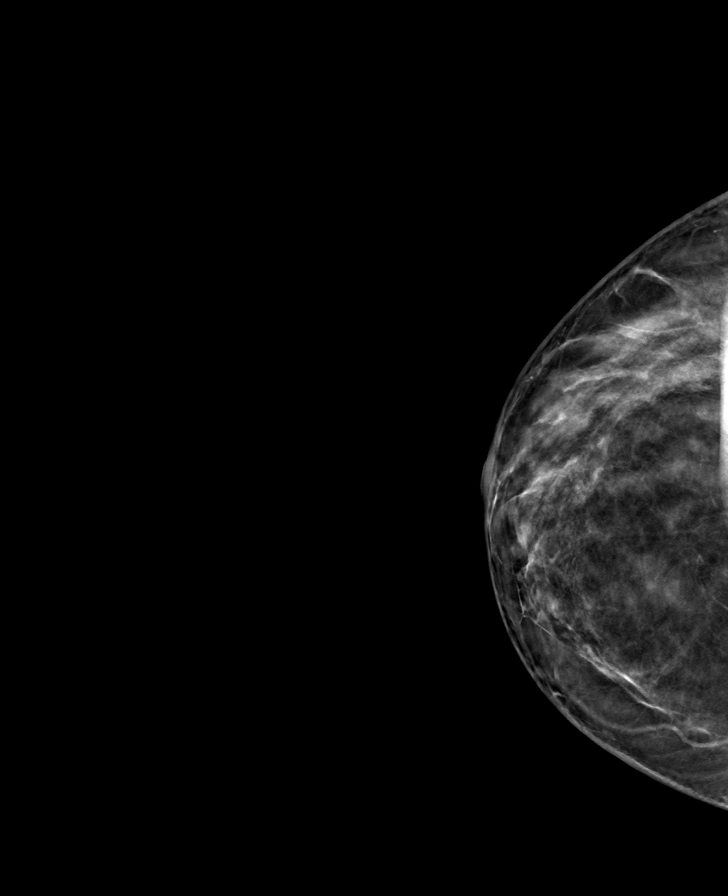

[L CC tomo · tomo slice 31/60.0]
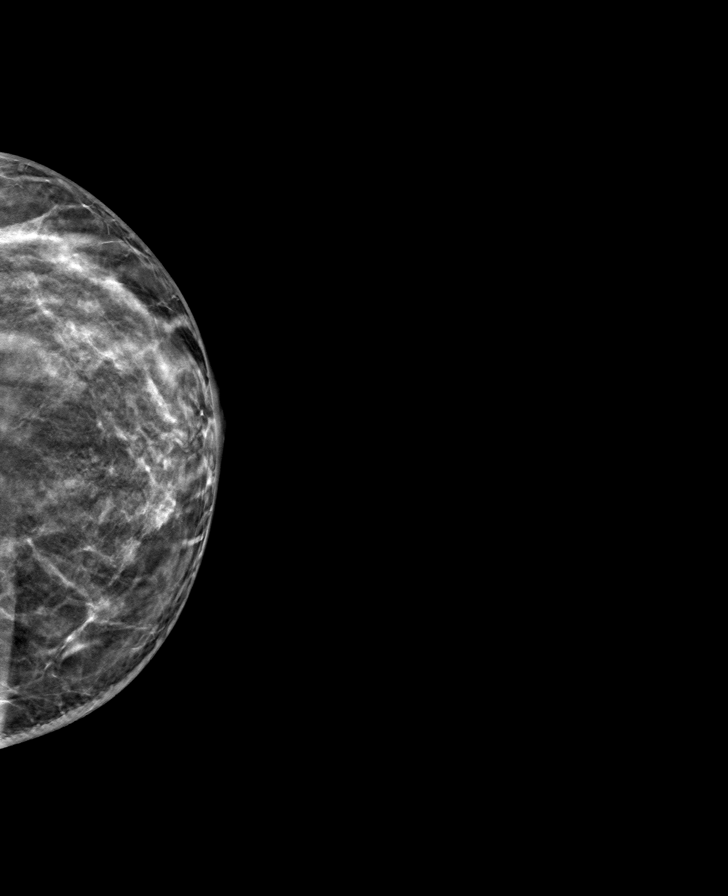

[8 of 24 positions shown; findings below may reference images not displayed]

ACR Breast Density Category c: The breast tissue is heterogeneously
dense, which may obscure small masses.
FINDINGS: There are no findings suspicious for malignancy.
IMPRESSION: No mammographic evidence of malignancy. A result letter of this
screening mammogram will be mailed directly to the patient.

RECOMMENDATION:
Screening mammogram in one year. (Code:Q3-W-BC3)

BI-RADS CATEGORY  1: Negative.

## 2023-04-27 ENCOUNTER — Ambulatory Visit: Payer: Medicare Other

## 2023-05-15 ENCOUNTER — Ambulatory Visit: Payer: Medicare Other | Admitting: Neurology

## 2023-05-28 ENCOUNTER — Encounter: Payer: Self-pay | Admitting: Neurology

## 2023-05-28 ENCOUNTER — Ambulatory Visit (INDEPENDENT_AMBULATORY_CARE_PROVIDER_SITE_OTHER): Payer: Medicare Other | Admitting: Neurology

## 2023-05-28 VITALS — BP 166/85 | HR 73

## 2023-05-28 DIAGNOSIS — G8250 Quadriplegia, unspecified: Secondary | ICD-10-CM | POA: Diagnosis not present

## 2023-05-28 DIAGNOSIS — R262 Difficulty in walking, not elsewhere classified: Secondary | ICD-10-CM

## 2023-05-28 DIAGNOSIS — G35 Multiple sclerosis: Secondary | ICD-10-CM

## 2023-05-28 DIAGNOSIS — R32 Unspecified urinary incontinence: Secondary | ICD-10-CM | POA: Diagnosis not present

## 2023-05-28 MED ORDER — BACLOFEN 10 MG PO TABS: ORAL_TABLET | ORAL | 4 refills | Status: AC
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
## 2023-05-29 ENCOUNTER — Telehealth: Payer: Self-pay | Admitting: Neurology

## 2023-05-29 DIAGNOSIS — R262 Difficulty in walking, not elsewhere classified: Secondary | ICD-10-CM

## 2023-05-29 DIAGNOSIS — G8250 Quadriplegia, unspecified: Secondary | ICD-10-CM

## 2023-05-29 DIAGNOSIS — G35 Multiple sclerosis: Secondary | ICD-10-CM

## 2023-05-29 NOTE — Telephone Encounter (Signed)
Pt was accepted by [facility], they will reach out to schedule.

## 2023-05-30 ENCOUNTER — Ambulatory Visit
Admission: RE | Admit: 2023-05-30 | Discharge: 2023-05-30 | Disposition: A | Discharge status: Home / Self Care | Payer: Medicare Other | Source: Ambulatory Visit | Attending: Family Medicine | Admitting: Family Medicine

## 2023-05-30 DIAGNOSIS — Z1231 Encounter for screening mammogram for malignant neoplasm of breast: Secondary | ICD-10-CM

## 2023-06-18 NOTE — Telephone Encounter (Addendum)
Received below message from [facility], please advise.   My PT has been trying to get orders approved for [facility] for sometime - Can you assist in getting orders for PT approved - Requesting Once a week x 8 weeks.     OT eval and tx order request     DME order for hospital bed sent to supplier/[facility]     Can you all help?

## 2023-06-18 NOTE — Addendum Note (Signed)
Addended by: Arther Abbott on: 06/18/2023 02:53 PM   Modules accepted: Orders

## 2023-06-18 NOTE — Telephone Encounter (Signed)
Danielle Mccarthy with [facility]/[phone]. Provided VO for PT 1xweek for 8 wk and VO for OT eval. He will pass along to PT.  They are also requesting order be sent for hospital bed to [facility]. MD approved. Order printed. Waiting on MD signature and then will fax.

## 2023-06-19 ENCOUNTER — Telehealth: Payer: Self-pay | Admitting: Neurology

## 2023-06-19 NOTE — Telephone Encounter (Signed)
Faxed DME Order to [facility] [phone] on 06/19/2023

## 2023-06-19 NOTE — Telephone Encounter (Signed)
Dana @ [facility] is asking for a call from [place], [place] for verbal orders for nursing plan of care 1 week 2, her voicemail is secure at [phone]

## 2023-06-19 NOTE — Telephone Encounter (Signed)
Patient requires positioning of the body in ways not feasible with an ordinary bed in order to alleviate pain. Patient requires the head of the bed to be elevated more than 30 degrees most of the time to alleviate pain and problems with aspiration.

## 2023-06-19 NOTE — Telephone Encounter (Signed)
Called back and LVM providing VO for nursing 1 time a week for 2 weeks. Asked her to call back if anything else is needed.

## 2023-07-04 ENCOUNTER — Telehealth: Payer: Self-pay | Admitting: Neurology

## 2023-07-04 NOTE — Telephone Encounter (Signed)
FYI: Physical Therapist Revonda Standard called to report high blood pressure reading for pt of 140/96. States to contact pt's son if any questions or concerns

## 2023-07-05 ENCOUNTER — Telehealth: Payer: Self-pay | Admitting: Neurology

## 2023-07-05 NOTE — Telephone Encounter (Signed)
OT Moria w/ [facility] has called for verbal orders for OT 1 week 5, Moria's vm is secure she can be reached at [phone]

## 2023-07-06 NOTE — Telephone Encounter (Signed)
LVM w/ Verbal orders for OT on 07/06/2023

## 2023-07-16 ENCOUNTER — Telehealth: Payer: Self-pay

## 2023-07-16 NOTE — Telephone Encounter (Signed)
Faxed off pt Client Coordination Note Report to [facility] [phone] on 07/16/2023.

## 2023-08-03 ENCOUNTER — Telehealth: Payer: Self-pay | Admitting: Neurology

## 2023-08-03 NOTE — Telephone Encounter (Signed)
LVM stating per Dr. Epimenio Foot that pt is to get PT Once every other week for 4 weeks.

## 2023-08-03 NOTE — Telephone Encounter (Signed)
Jae Dire, PT w/ [facility] requesting verbal orders for once every other week for 4 weeks.  Kate's vm is secure at [phone]

## 2023-08-07 ENCOUNTER — Telehealth: Payer: Self-pay | Admitting: Neurology

## 2023-08-07 NOTE — Telephone Encounter (Signed)
Moira OT w/ [facility] has called for verbal orders for 1 week 8 for OT her vm is secure

## 2023-08-07 NOTE — Telephone Encounter (Signed)
Called Moira back and gave her Verbal Orders for OT for patient.

## 2023-10-24 ENCOUNTER — Telehealth: Payer: Self-pay | Admitting: Neurology

## 2023-10-24 NOTE — Telephone Encounter (Signed)
 Pt's son Dorene Grebe, question regarding wheelchair since the last. Would like a call from the nurse.

## 2023-10-25 NOTE — Telephone Encounter (Signed)
 LVM for son to call back and discuss what's needed for patients wheelchair

## 2023-10-25 NOTE — Telephone Encounter (Signed)
 Spoke to  son (checked DPR) son states in last office visit with Dr Epimenio Foot  was suppose to order new wheelchair because patient has had it 5 years or more . Made son aware will forward to Dr Epimenio Foot to place order for new wheel chair

## 2023-10-29 ENCOUNTER — Other Ambulatory Visit: Payer: Self-pay | Admitting: Neurology

## 2023-10-29 DIAGNOSIS — G800 Spastic quadriplegic cerebral palsy: Secondary | ICD-10-CM

## 2023-10-29 DIAGNOSIS — G8250 Quadriplegia, unspecified: Secondary | ICD-10-CM

## 2023-10-29 DIAGNOSIS — R262 Difficulty in walking, not elsewhere classified: Secondary | ICD-10-CM

## 2023-10-29 DIAGNOSIS — G35D Multiple sclerosis, unspecified: Secondary | ICD-10-CM

## 2023-10-29 DIAGNOSIS — G35 Multiple sclerosis: Secondary | ICD-10-CM

## 2023-10-30 NOTE — Telephone Encounter (Addendum)
 Sent new orders for Patient to [name] from [facility] this am

## 2023-10-30 NOTE — Telephone Encounter (Signed)
 LVM to make son aware order has been sent to [facility] this morning

## 2023-11-19 ENCOUNTER — Encounter: Payer: Self-pay | Admitting: *Deleted

## 2023-11-19 ENCOUNTER — Telehealth: Payer: Self-pay | Admitting: Neurology

## 2023-11-19 NOTE — Telephone Encounter (Signed)
 Spoke w/ MD. He approved writing. Letter printed, waiting on MD signature.

## 2023-11-19 NOTE — Telephone Encounter (Signed)
 Pt son dropped off letter for jury duty pt received. Pt would like a letter to be exempt. Please call son at [phone] or [phone] when letter is ready and he will PU

## 2023-11-20 NOTE — Telephone Encounter (Signed)
 LVM letting son know letter ready for pick up. Placed up front.

## 2023-12-19 ENCOUNTER — Encounter: Payer: Self-pay | Admitting: Neurology

## 2024-02-01 ENCOUNTER — Telehealth: Payer: Self-pay | Admitting: *Deleted

## 2024-02-01 NOTE — Telephone Encounter (Signed)
Pt parking placard ready for p/u

## 2024-02-14 ENCOUNTER — Ambulatory Visit (INDEPENDENT_AMBULATORY_CARE_PROVIDER_SITE_OTHER): Admitting: Neurology

## 2024-02-14 ENCOUNTER — Encounter: Payer: Self-pay | Admitting: Neurology

## 2024-02-14 VITALS — BP 141/63 | HR 72

## 2024-02-14 DIAGNOSIS — R32 Unspecified urinary incontinence: Secondary | ICD-10-CM

## 2024-02-14 DIAGNOSIS — L89159 Pressure ulcer of sacral region, unspecified stage: Secondary | ICD-10-CM

## 2024-02-14 DIAGNOSIS — R262 Difficulty in walking, not elsewhere classified: Secondary | ICD-10-CM

## 2024-02-14 DIAGNOSIS — G35 Multiple sclerosis: Secondary | ICD-10-CM | POA: Diagnosis not present

## 2024-02-14 DIAGNOSIS — G8250 Quadriplegia, unspecified: Secondary | ICD-10-CM | POA: Diagnosis not present

## 2024-02-14 MED ORDER — MELOXICAM 7.5 MG PO TABS
7.5000 mg | ORAL_TABLET | Freq: Every day | ORAL | 3 refills | Status: AC
Start: 2024-02-14 — End: ?
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
## 2024-02-15 NOTE — Progress Notes (Signed)
 Faxed signed order for power wheelchair/office notes to [facility] at [phone]. Received fax confirmation.

## 2024-04-11 ENCOUNTER — Encounter (HOSPITAL_COMMUNITY): Payer: Self-pay

## 2024-04-11 ENCOUNTER — Observation Stay (HOSPITAL_COMMUNITY)
Admission: EM | Admit: 2024-04-11 | Discharge: 2024-04-13 | Disposition: A | Discharge status: Home / Self Care | Attending: Internal Medicine | Admitting: Internal Medicine

## 2024-04-11 ENCOUNTER — Other Ambulatory Visit: Payer: Self-pay

## 2024-04-11 ENCOUNTER — Emergency Department (HOSPITAL_COMMUNITY)

## 2024-04-11 DIAGNOSIS — Z7401 Bed confinement status: Secondary | ICD-10-CM | POA: Diagnosis not present

## 2024-04-11 DIAGNOSIS — G800 Spastic quadriplegic cerebral palsy: Secondary | ICD-10-CM | POA: Insufficient documentation

## 2024-04-11 DIAGNOSIS — N2889 Other specified disorders of kidney and ureter: Secondary | ICD-10-CM | POA: Insufficient documentation

## 2024-04-11 DIAGNOSIS — Z79899 Other long term (current) drug therapy: Secondary | ICD-10-CM | POA: Diagnosis not present

## 2024-04-11 DIAGNOSIS — G35 Multiple sclerosis: Secondary | ICD-10-CM | POA: Diagnosis not present

## 2024-04-11 DIAGNOSIS — H9190 Unspecified hearing loss, unspecified ear: Secondary | ICD-10-CM | POA: Insufficient documentation

## 2024-04-11 DIAGNOSIS — I10 Essential (primary) hypertension: Secondary | ICD-10-CM | POA: Diagnosis present

## 2024-04-11 DIAGNOSIS — Z993 Dependence on wheelchair: Secondary | ICD-10-CM | POA: Diagnosis not present

## 2024-04-11 DIAGNOSIS — G8250 Quadriplegia, unspecified: Secondary | ICD-10-CM | POA: Insufficient documentation

## 2024-04-11 DIAGNOSIS — N179 Acute kidney failure, unspecified: Secondary | ICD-10-CM | POA: Insufficient documentation

## 2024-04-11 DIAGNOSIS — R55 Syncope and collapse: Principal | ICD-10-CM | POA: Insufficient documentation

## 2024-04-11 LAB — COMPREHENSIVE METABOLIC PANEL WITH GFR
ALT: 11 U/L (ref 0–44)
AST: 25 U/L (ref 15–41)
Albumin: 3 g/dL — ABNORMAL LOW (ref 3.5–5.0)
Alkaline Phosphatase: 61 U/L (ref 38–126)
Anion gap: 9 (ref 5–15)
BUN: 15 mg/dL (ref 8–23)
CO2: 25 mmol/L (ref 22–32)
Calcium: 8.6 mg/dL — ABNORMAL LOW (ref 8.9–10.3)
Chloride: 104 mmol/L (ref 98–111)
Creatinine, Ser: 1.13 mg/dL — ABNORMAL HIGH (ref 0.44–1.00)
GFR, Estimated: 51 mL/min — ABNORMAL LOW (ref >60)
Glucose, Bld: 130 mg/dL — ABNORMAL HIGH (ref 70–99)
Potassium: 4 mmol/L (ref 3.5–5.1)
Sodium: 138 mmol/L (ref 135–145)
Total Bilirubin: 0.7 mg/dL (ref 0.0–1.2)
Total Protein: 6.3 g/dL — ABNORMAL LOW (ref 6.5–8.1)

## 2024-04-11 LAB — CBC
HCT: 38.8 % (ref 36.0–46.0)
Hemoglobin: 12 g/dL (ref 12.0–15.0)
MCH: 26.2 pg (ref 26.0–34.0)
MCHC: 30.9 g/dL (ref 30.0–36.0)
MCV: 84.7 fL (ref 80.0–100.0)
Platelets: 198 K/uL (ref 150–400)
RBC: 4.58 MIL/uL (ref 3.87–5.11)
RDW: 14.1 % (ref 11.5–15.5)
WBC: 5.9 K/uL (ref 4.0–10.5)
nRBC: 0 % (ref 0.0–0.2)

## 2024-04-11 LAB — BRAIN NATRIURETIC PEPTIDE: B Natriuretic Peptide: 31.5 pg/mL (ref 0.0–100.0)

## 2024-04-11 LAB — CBG MONITORING, ED: Glucose-Capillary: 136 mg/dL — ABNORMAL HIGH (ref 70–99)

## 2024-04-11 LAB — TROPONIN I (HIGH SENSITIVITY): Troponin I (High Sensitivity): 4 ng/L (ref <18)

## 2024-04-11 NOTE — ED Triage Notes (Signed)
 Pt was at home had some chest pain and dizziness, which resolved when EMS arrived, pt originally was a little soft Systolic at 90, but resolved when EMS arrived.  Past Medical History:  Diagnosis Date   Breast mass    mass left breast   Hypertension    Multiple sclerosis 2003   Vision abnormalities

## 2024-04-11 NOTE — H&P (Signed)
 History and Physical    Danielle Mccarthy FMW:[id number] DOB: [birth year] DOA: 04/11/2024  PCP: Ilah Crigler, MD  Patient coming from: Home  I have personally briefly reviewed patient's old medical records in [facility]  Chief Complaint: Chest pain, near syncope  HPI: Danielle Mccarthy is a 73 y.o. 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  Patient states she was sitting down eating dinner when all of a sudden she developed midsternal chest pressure and seeing spots in her vision.  She felt nauseous but did not throw up.  Her son noted that her skin looked wet.  The symptoms resolved after about 5 minutes.  Elevated later after her son got her to bed she had an episode where she began to look pale with dry lips.  She was just staring off for a little while.  EMS were called to the home.  She was noted to have systolic BP of 90 which improved without intervention.  She was brought to the ED for further evaluation.  Son states that she had a similar episode about 3 weeks ago.  He also notes that sometimes she will develop a rapid heart rate when she eats something heavy.  He says that this will resolve if he places cool compresses to her head.  She is bedridden/wheelchair dependent at baseline due to her multiple sclerosis.  She requires assistance with most ADLs, transferring, simple chores, and feeding self.  ED Course  Labs/Imaging on admission: I have personally reviewed following labs and imaging studies.  Initial vitals showed BP 118/52, pulse 85, RR 23, temp 97.6 F, SpO2 100% on room air.  Labs showed sodium 138, potassium 4.0, bicarb 25, BUN 15, creatinine 1.13, serum glucose 130, LFTs within normal limits, WBC 5.9, hemoglobin 12.0, platelets 198, troponin 4, BNP  31.5.  Portable chest x-ray showed low lung volumes without focal consolidation, edema, effusion.  The hospitalist service was consulted for admission.  Review of Systems: All systems reviewed and are negative except as documented in history of present illness above.   Past Medical History:  Diagnosis Date   Breast mass    mass left breast   Hypertension    Multiple sclerosis 2003   Vision abnormalities     Past Surgical History:  Procedure Laterality Date   KNEE SURGERY Left    PARTIAL HYSTERECTOMY      Social History: Social History   Tobacco Use   Smoking status: Never   Smokeless tobacco: Never  Substance Use Topics   Alcohol use: No   Drug use: No   No Known Allergies  Family History  Problem Relation Age of Onset   Hypertension Brother    Heart failure Mother    Kidney failure Mother    Other Father 45       car accident     Prior to Admission medications   Medication Sig Start Date End Date Taking? Authorizing Provider  baclofen  (LIORESAL ) 10 MG tablet Take 1/2 to 1 pill tid 05/28/23   Sater, Charlie LABOR, MD  carvedilol  (COREG ) 25 MG tablet Take 25 mg by mouth. Patient not taking: Reported on 02/14/2024    [provider]  furosemide (LASIX) 20 MG tablet Take 20 mg by mouth daily. Patient not taking: Reported on 02/14/2024 10/15/21   [provider]  HYDROcodone -acetaminophen  (NORCO) 5-325 MG tablet Take 1-2 tablets by mouth every 6 (six) hours as needed. Patient not taking: Reported on 02/14/2024 02/03/22   Geroldine Berg, MD  lisinopril-hydrochlorothiazide (ZESTORETIC) 10-12.5 MG tablet Take 1 tablet by mouth every morning. 11/27/23   [provider]  meloxicam  (MOBIC ) 7.5 MG tablet Take 1 tablet (7.5 mg total) by mouth daily. 02/14/24   Vear Charlie LABOR, MD    Physical Exam: Vitals:   04/11/24 2019 04/11/24 2021 04/11/24 2304  BP:  (!) 118/52 (!) 95/49  Pulse: 85  71  Resp: (!) 23  20  Temp: 97.6 F (36.4 C)    TempSrc: Oral     SpO2: 100%  100%  Weight: 70 kg    Height: 5' 1 (1.549 m)     Constitutional: Resting in bed with head elevated, NAD, calm, comfortable Eyes: EOMI, lids and conjunctivae normal ENMT: Hard of hearing, hearing aid in place.  Mucous membranes are moist. Posterior pharynx clear of any exudate or lesions.Normal dentition.  Neck: normal, supple, no masses. Respiratory: clear to auscultation anteriorly. Normal respiratory effort. No accessory muscle use.  Cardiovascular: Regular rate and rhythm, systolic murmur present. No extremity edema. 2+ pedal pulses. Abdomen: no tenderness, no masses palpated. Musculoskeletal: no clubbing / cyanosis.  Contractures both hands, ROM diminished all extremities. Skin: no rashes, lesions, ulcers. No induration Neurologic: Spastic quadriparesis Psychiatric: Normal judgment and insight. Alert and oriented x 3. Normal mood.   EKG: Personally reviewed. Sinus rhythm, rate 88, no acute ischemic changes.  Rate is faster when compared to previous.  Assessment/Plan Principal Problem:   Near syncope Active Problems:   Multiple sclerosis (HCC)   Hypertension   Danielle Mccarthy is a 73 y.o. 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  Assessment and Plan: Near syncope: Patient presenting with near syncopal episode associated with chest pressure.  Systolic murmur noted.  Initial troponin is negative.  EKG without acute ischemic changes.  Vagovagal or hypotensive event included on differential. - Keep on telemetry - Start IV fluid hydration overnight - Hold antihypertensives - Obtain echocardiogram  Mild renal insufficiency: Creatinine 1.13 on admission.  BP has been borderline low.  Continue IV fluid hydration as above, hold ACE/ARB, and repeat labs in AM.  Hypertension: Holding home  antihypertensive for now.  Multiple sclerosis with spastic quadriparesis: Follows with neurology Dr. Pamela.  Not currently on disease modifying therapy.  She is wheelchair dependent at baseline.  Continue baclofen  as needed.   DVT prophylaxis: enoxaparin  (LOVENOX ) injection 40 mg Start: 04/12/24 1000 Code Status: Full code, discussed with patient and son on admission Family Communication: Son at bedside Disposition Plan: From home and likely return to home pending clinical progress Consults called: None Severity of Illness: The appropriate patient status for this patient is OBSERVATION. Observation status is judged to be reasonable and necessary in order to provide the required intensity of service to ensure the patient's safety. The patient's presenting symptoms, physical exam findings, and initial radiographic and laboratory data in the context of their medical condition is felt to place them at decreased risk for further clinical deterioration. Furthermore, it is anticipated that the patient will be medically stable for discharge from the hospital within 2 midnights of admission.   Jorie Blanch MD [facility]  If 7PM-7AM, please contact night-coverage [email or web address]  04/12/2024, 12:34 AM

## 2024-04-11 NOTE — ED Provider Notes (Signed)
 [facility] AT [facility] Provider Note   CSN: [id number] Arrival date & time: 04/11/24  2013     Patient presents with: Near Syncope   Danielle Mccarthy. Danielle Mccarthy is a 73 y.o. female.    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  Patient denies head trauma or LOC.  She reports no accompanying headache, chest pain, palpitations, syncope, back pain, nausea, vomiting, fevers, chills, abdominal pain, changes in urination, or changes in bowel movements.  No recent falls or head trauma.  No prehospital interventions per EMS.  Prior to Admission medications   Medication Sig Start Date End Date Taking? Authorizing Provider  baclofen  (LIORESAL ) 10 MG tablet Take 1/2 to 1 pill tid 05/28/23   Sater, Charlie LABOR, MD  carvedilol  (COREG ) 25 MG tablet Take 25 mg by mouth. Patient not taking: Reported on 02/14/2024    [provider]  furosemide (LASIX) 20 MG tablet Take 20 mg by mouth daily. Patient not taking: Reported on 02/14/2024 10/15/21   [provider]  HYDROcodone -acetaminophen  (NORCO) 5-325 MG tablet Take 1-2 tablets by mouth every 6 (six) hours as needed. Patient not taking: Reported on 02/14/2024 02/03/22   Geroldine Berg, MD  lisinopril-hydrochlorothiazide (ZESTORETIC) 10-12.5 MG tablet Take 1 tablet by mouth every morning. 11/27/23   [provider]  meloxicam  (MOBIC ) 7.5 MG tablet Take 1 tablet (7.5 mg total) by mouth daily. 02/14/24   Sater, Charlie LABOR, MD    Allergies: Patient has no known allergies.    Review of Systems  Cardiovascular:  Positive for near-syncope.    Updated Vital Signs BP (!) 95/49   Pulse 71   Temp 97.6 F (36.4 C) (Oral)   Resp 20   Ht 5' 1  (1.549 m)   Wt 70 kg   SpO2 100%   BMI 29.16 kg/m   Physical Exam Constitutional:      Appearance: Normal appearance.  HENT:     Head: Normocephalic and atraumatic.     Mouth/Throat:     Mouth: Mucous membranes are moist.     Pharynx: Oropharynx is clear.  Eyes:     Extraocular Movements: Extraocular movements intact.     Pupils: Pupils are equal, round, and reactive to light.  Neck:     Comments: No C-spine midline tenderness to palpation Cardiovascular:     Rate and Rhythm: Normal rate and regular rhythm.     Pulses: Normal pulses.     Heart sounds: Normal heart sounds.  Pulmonary:     Effort: Pulmonary effort is normal.     Breath sounds: Normal breath sounds.  Abdominal:     General: Abdomen is flat.     Palpations: Abdomen is soft.  Musculoskeletal:     Cervical back: Normal range of motion.     Comments: Baseline spasticity of bilateral upper and lower extremities with upper extremity flexion  Skin:    General: Skin is warm and dry.     Capillary Refill: Capillary refill takes less than 2 seconds.  Neurological:     Mental Status: She is alert and oriented to person, place, and time. Mental status is at baseline.     (all labs ordered are listed, but only abnormal results are displayed) Labs Reviewed  COMPREHENSIVE METABOLIC PANEL WITH GFR - Abnormal; Notable for the following components:      Result Value   Glucose, Bld 130 (*)    Creatinine, Ser 1.13 (*)    Calcium 8.6 (*)    Total Protein 6.3 (*)    Albumin 3.0 (*)    GFR, Estimated 51 (*)    All other components within normal limits  CBG MONITORING, ED - Abnormal; Notable for the following components:   Glucose-Capillary 136 (*)    All other components within normal limits  CBC  BRAIN NATRIURETIC PEPTIDE  URINALYSIS, ROUTINE W REFLEX MICROSCOPIC  TROPONIN I (HIGH SENSITIVITY)  TROPONIN I (HIGH SENSITIVITY)    EKG: EKG Interpretation Date/Time:  Friday April 11 2024 20:19:20 EDT Ventricular  Rate:  88 PR Interval:  185 QRS Duration:  96 QT Interval:  375 QTC Calculation: 454 R Axis:   38  Text Interpretation: Sinus rhythm Low voltage, precordial leads Confirmed by Cottie Cough [phone]) on 04/11/2024 8:27:53 PM  Radiology: DG Chest Portable 1 View Result Date: 04/11/2024 CLINICAL DATA:  Near syncope. EXAM: PORTABLE CHEST 1 VIEW COMPARISON:  June 25, 2020 FINDINGS: The heart size and mediastinal contours are within normal limits. Low lung volumes are noted. No acute infiltrate, pleural effusion or pneumothorax is identified. Multilevel degenerative changes are present throughout the thoracic spine. IMPRESSION: Low lung volumes without acute or active cardiopulmonary disease. Electronically Signed   By: Suzen Dials M.D.   On: 04/11/2024 22:19     Procedures   Medications Ordered in the ED - No data to display  Clinical Course as of 04/11/24 2335  Fri Apr 11, 2024  2158 This is a 73 year old female with a history of multiple sclerosis and quadriplegia presenting to the ED with concern for near syncope.  Her son at the bedside reports that they were eating today and the patient complained of feeling hot and flushed and lightheaded.  He says that her eyes briefly rolled up in her head but she did not lose consciousness or have shaking.  She became very briefly unresponsive.  The patient recalls feeling very lightheaded having tunnel vision before the black spots in her vision.  He says this has happened a few times over the past few years.  She is scheduled to have an echocardiogram in the next week as an outpatient.  The patient reports he did have some chest pressure earlier with her symptoms but that is now resolved.  On arrival her vital signs appear normal.  White blood cell count and hemoglobin are normal.  Glucose normal.  Patient is pending CMP, troponin.  Her EKG per my interpretation does not show any acute ischemia.  She will likely require admission for  echocardiogram and observation for near syncope. [MT]  2226 [ ]  Troponin  [WB]  2304 Pending hospitalist consultation. [WB]    Clinical Course User Index [MT] Trifan, Cough PARAS, MD [WB] Dorcus Fallow, MD                                 Medical Decision Making Amount and/or Complexity of Data Reviewed Labs: ordered. Radiology: ordered.  73 year old female with longstanding history of MS diagnosed in 2003 and spastic quadriplegia with wheelchair dependence presenting to the ED following transient episode of presyncopal symptoms of chest tightness and spots of vision, lasting 5 minutes, with spontaneous resolution.  Patient denies any recent infectious symptoms.  No active somatic  complaints at this time.  No recent falls or head trauma.  No focal neurologic deficits from baseline.  Upon arrival, patient hemodynamically stable.  Normotensive.  Afebrile.  No tachycardia.  No tachypnea.  Saturating 100% RA.  GCS 15.  Patient asymptomatic, nontoxic-appearing, in no acute distress.  EKG reviewed, indicative of normal sinus rhythm with normal intervals and no evidence of preexcitation.  No acute ischemic changes.  No STEMI.  Troponin 4.  BNP unremarkable.  At this time, patient to be admitted for high risk presyncopal/syncopal episode.  Discussed with hospitalist service, patient to be admitted to their service for ongoing medical management.  Final diagnoses:  Near syncope    ED Discharge Orders     None          Reign Bartnick, Elsie, MD 04/11/24 7664    Cottie Donnice PARAS, MD 04/12/24 1200

## 2024-04-12 ENCOUNTER — Observation Stay (HOSPITAL_COMMUNITY)

## 2024-04-12 DIAGNOSIS — R55 Syncope and collapse: Secondary | ICD-10-CM

## 2024-04-12 LAB — URINALYSIS, ROUTINE W REFLEX MICROSCOPIC
Bilirubin Urine: NEGATIVE
Glucose, UA: NEGATIVE mg/dL
Hgb urine dipstick: NEGATIVE
Ketones, ur: NEGATIVE mg/dL
Nitrite: POSITIVE — AB
Protein, ur: NEGATIVE mg/dL
Specific Gravity, Urine: 1.014 (ref 1.005–1.030)
pH: 7 (ref 5.0–8.0)

## 2024-04-12 LAB — CBC
HCT: 37.1 % (ref 36.0–46.0)
Hemoglobin: 11.9 g/dL — ABNORMAL LOW (ref 12.0–15.0)
MCH: 26.8 pg (ref 26.0–34.0)
MCHC: 32.1 g/dL (ref 30.0–36.0)
MCV: 83.6 fL (ref 80.0–100.0)
Platelets: 128 K/uL — ABNORMAL LOW (ref 150–400)
RBC: 4.44 MIL/uL (ref 3.87–5.11)
RDW: 14.3 % (ref 11.5–15.5)
WBC: 2.5 K/uL — ABNORMAL LOW (ref 4.0–10.5)
nRBC: 4 % — ABNORMAL HIGH (ref 0.0–0.2)

## 2024-04-12 LAB — BASIC METABOLIC PANEL WITH GFR
Anion gap: 10 (ref 5–15)
BUN: 18 mg/dL (ref 8–23)
CO2: 26 mmol/L (ref 22–32)
Calcium: 9 mg/dL (ref 8.9–10.3)
Chloride: 104 mmol/L (ref 98–111)
Creatinine, Ser: 1.11 mg/dL — ABNORMAL HIGH (ref 0.44–1.00)
GFR, Estimated: 52 mL/min — ABNORMAL LOW (ref >60)
Glucose, Bld: 101 mg/dL — ABNORMAL HIGH (ref 70–99)
Potassium: 4.4 mmol/L (ref 3.5–5.1)
Sodium: 140 mmol/L (ref 135–145)

## 2024-04-12 LAB — ECHOCARDIOGRAM COMPLETE
AV Peak grad: 67.4 mmHg
Ao pk vel: 4.11 m/s
Area-P 1/2: 3.17 cm2
Est EF: >75
Height: 61 in
S' Lateral: 2.3 cm
Weight: 2328.06 [oz_av]

## 2024-04-12 LAB — GLUCOSE, CAPILLARY: Glucose-Capillary: 96 mg/dL (ref 70–99)

## 2024-04-12 LAB — TROPONIN I (HIGH SENSITIVITY): Troponin I (High Sensitivity): 8 ng/L

## 2024-04-12 MED ORDER — ACETAMINOPHEN 325 MG PO TABS: 650.0000 mg | ORAL_TABLET | Freq: Four times a day (QID) | ORAL | Status: DC | PRN

## 2024-04-12 MED ORDER — BISACODYL 5 MG PO TBEC: 5.0000 mg | DELAYED_RELEASE_TABLET | Freq: Every day | ORAL | Status: DC | PRN

## 2024-04-12 MED ORDER — SENNOSIDES-DOCUSATE SODIUM 8.6-50 MG PO TABS: 1.0000 | ORAL_TABLET | Freq: Every evening | ORAL | Status: DC | PRN

## 2024-04-12 MED ORDER — SODIUM CHLORIDE 0.9% FLUSH
3.0000 mL | Freq: Two times a day (BID) | INTRAVENOUS | Status: DC
Start: 2024-04-12 — End: 2024-04-13
  Administered 2024-04-12 – 2024-04-13 (×3): 3 mL via INTRAVENOUS

## 2024-04-12 MED ORDER — BACLOFEN 10 MG PO TABS
5.0000 mg | ORAL_TABLET | Freq: Three times a day (TID) | ORAL | Status: DC | PRN
  Administered 2024-04-13: 10 mg via ORAL
  Filled 2024-04-12: qty 1

## 2024-04-12 MED ORDER — SODIUM CHLORIDE 0.9 % IV SOLN: INTRAVENOUS | Status: DC

## 2024-04-12 MED ORDER — PERFLUTREN LIPID MICROSPHERE
1.0000 mL | INTRAVENOUS | Status: AC | PRN
  Administered 2024-04-12: 4 mL via INTRAVENOUS

## 2024-04-12 MED ORDER — LACTATED RINGERS IV SOLN: INTRAVENOUS | Status: AC

## 2024-04-12 MED ORDER — ONDANSETRON HCL 4 MG PO TABS: 4.0000 mg | ORAL_TABLET | Freq: Four times a day (QID) | ORAL | Status: DC | PRN

## 2024-04-12 MED ORDER — ENOXAPARIN SODIUM 40 MG/0.4ML IJ SOSY
40.0000 mg | PREFILLED_SYRINGE | INTRAMUSCULAR | Status: DC
  Administered 2024-04-12 – 2024-04-13 (×2): 40 mg via SUBCUTANEOUS
  Filled 2024-04-12 (×2): qty 0.4

## 2024-04-12 MED ORDER — ACETAMINOPHEN 650 MG RE SUPP: 650.0000 mg | Freq: Four times a day (QID) | RECTAL | Status: DC | PRN

## 2024-04-12 MED ORDER — ONDANSETRON HCL 4 MG/2ML IJ SOLN: 4.0000 mg | Freq: Four times a day (QID) | INTRAMUSCULAR | Status: DC | PRN

## 2024-04-12 NOTE — Care Management Obs Status (Signed)
 MEDICARE OBSERVATION STATUS NOTIFICATION   Patient Details  Name: Danielle Mccarthy. Dovey Fatzinger MRN: [id number] Date of Birth: [birth year]   Medicare Observation Status Notification Given:  Yes    Carletha Spruce, RN 04/12/2024, 3:55 PM

## 2024-04-12 NOTE — Hospital Course (Signed)
 Danielle Mccarthy. [name] is a 73 y.o. 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

## 2024-04-12 NOTE — Progress Notes (Signed)
*  PRELIMINARY RESULTS* Echocardiogram 2D Echocardiogram has been performed with [facility].  Danielle Mccarthy 04/12/2024, 2:31 PM

## 2024-04-12 NOTE — Plan of Care (Signed)

## 2024-04-12 NOTE — Progress Notes (Addendum)
 PROGRESS NOTE  Danielle Mccarthy. Danielle Mccarthy  FMW:[id number] DOB: [birth year] DOA: 04/11/2024 PCP: Ilah Crigler, MD   Brief Narrative: Patient is a 89 female with history of primary progressive multiple sclerosis with spastic quadriparesis, wheelchair dependence, hypertension, hard of hearing who presented with chest pain, near loss of consciousness.  Patient was sitting down eating dinner when suddenly she developed midsternal chest pain, seeing spots in her vision, felt nauseous.  Symptoms resolved after 5 minutes .she also had a staring episode.  EMS found her hypotensive.  She had similar episode about 3 weeks ago.  Patient is chronically bedbound/wheelchair dependent due to her multiple sclerosis.  Required assistance with most ADLs.  On presentation, blood pressure stable.  Other vitals were stable.  Lab works were reassuring.  Chest x-ray showed low lung volumes without focal consolidation edema or effusion.  Patient admitted for the evaluation of syncopal event.  Currently on gentle IV fluids.  Echo.carotid doppler pending  Assessment & Plan:  Principal Problem:   Near syncope Active Problems:   Multiple sclerosis (HCC)   Hypertension  Near syncope: Presented with near syncopal episode with chest pressure.  Found to have systolic murmur.  Troponin negative.  EKG without ischemic changes.  EMS had found her hypotensive.  Could be vasovagal or hypovolemic syncope.  Started on IV fluid.  Will check echocardiogram/carotid doppler.  Hold antihypertensives.  Mild AKI: Creatinine 1.1 on admission.  Currently stable.  History of hypertension: Hypotensive on presentation.  Antihypertensives on hold.  Monitor blood pressure, currently stable  Multiple sclerosis with spastic quadriplegia paresis: Follows with Dr. Pamela.  Currently not on disease modifying drugs.  Wheel Chair dependent.  Continue baclofen  as needed         DVT prophylaxis:enoxaparin  (LOVENOX ) injection 40 mg Start: 04/12/24  1000     Code Status: Full Code  Family Communication: called son Sigmund on phone today,call not received  Patient status:Obs  Patient is from :home  Anticipated discharge to: Home  Estimated DC date: 1 to 2 days   Consultants: None  Procedures:None  Antimicrobials:  Anti-infectives (From admission, onward)    None       Subjective: Patient seen and examined at bedside today.  Hemodynamically stable .she looks comfortable.  Blood pressure has improved.  She is alert and oriented.  Has spastic paraparesis.  Continue current distress  Objective: Vitals:   04/12/24 0200 04/12/24 0206 04/12/24 0330 04/12/24 0755  BP: 108/61 108/61 (!) 116/97 (!) 142/62  Pulse: 70 72 65 67  Resp: 19 16 19    Temp:  97.8 F (36.6 C) 97.6 F (36.4 C) 97.9 F (36.6 C)  TempSrc:  Oral    SpO2: 100% 100% 100% 100%  Weight:   66 kg   Height:   5' 1 (1.549 m)     Intake/Output Summary (Last 24 hours) at 04/12/2024 0829 Last data filed at 04/12/2024 0023 Gross per 24 hour  Intake 150 ml  Output --  Net 150 ml   Filed Weights   04/11/24 2019 04/12/24 0330  Weight: 70 kg 66 kg    Examination:  General exam: Overall comfortable, not in distress HEENT: PERRL Respiratory system:  no wheezes or crackles  Cardiovascular system: S1 & S2 heard, RRR.  Gastrointestinal system: Abdomen is nondistended, soft and nontender. Central nervous system: Alert and oriented, spastic quadriparesis Extremities: No edema, no clubbing ,no cyanosis, wasting of muscles on bilateral lower extremities Skin: No rashes, no ulcers,no icterus     Data Reviewed: I  have personally reviewed following labs and imaging studies  CBC: Recent Labs  Lab 04/11/24 2117 04/12/24 0110  WBC 5.9 2.5*  HGB 12.0 11.9*  HCT 38.8 37.1  MCV 84.7 83.6  PLT 198 128*   Basic Metabolic Panel: Recent Labs  Lab 04/11/24 2117 04/12/24 0709  NA 138 140  K 4.0 4.4  CL 104 104  CO2 25 26  GLUCOSE 130* 101*  BUN 15 18   CREATININE 1.13* 1.11*  CALCIUM 8.6* 9.0     No results found for this or any previous visit (from the past 240 hours).   Radiology Studies: DG Chest Portable 1 View Result Date: 04/11/2024 CLINICAL DATA:  Near syncope. EXAM: PORTABLE CHEST 1 VIEW COMPARISON:  June 25, 2020 FINDINGS: The heart size and mediastinal contours are within normal limits. Low lung volumes are noted. No acute infiltrate, pleural effusion or pneumothorax is identified. Multilevel degenerative changes are present throughout the thoracic spine. IMPRESSION: Low lung volumes without acute or active cardiopulmonary disease. Electronically Signed   By: Suzen Dials M.D.   On: 04/11/2024 22:19    Scheduled Meds:  enoxaparin  (LOVENOX ) injection  40 mg Subcutaneous Q24H   sodium chloride  flush  3 mL Intravenous Q12H   Continuous Infusions:  lactated ringers  100 mL/hr at 04/12/24 0334     LOS: 0 days   Ivonne Mustache, MD [facility] P9/27/2025, 8:29 AM

## 2024-04-13 ENCOUNTER — Observation Stay (HOSPITAL_COMMUNITY)

## 2024-04-13 DIAGNOSIS — R55 Syncope and collapse: Secondary | ICD-10-CM

## 2024-04-13 LAB — CBC
HCT: 34.3 % — ABNORMAL LOW (ref 36.0–46.0)
Hemoglobin: 10.8 g/dL — ABNORMAL LOW (ref 12.0–15.0)
MCH: 26.3 pg (ref 26.0–34.0)
MCHC: 31.5 g/dL (ref 30.0–36.0)
MCV: 83.7 fL (ref 80.0–100.0)
Platelets: 222 K/uL (ref 150–400)
RBC: 4.1 MIL/uL (ref 3.87–5.11)
RDW: 14 % (ref 11.5–15.5)
WBC: 6.2 K/uL (ref 4.0–10.5)
nRBC: 0 % (ref 0.0–0.2)

## 2024-04-13 LAB — BASIC METABOLIC PANEL WITH GFR
Anion gap: 3 — ABNORMAL LOW (ref 5–15)
BUN: 12 mg/dL (ref 8–23)
CO2: 25 mmol/L (ref 22–32)
Calcium: 8.2 mg/dL — ABNORMAL LOW (ref 8.9–10.3)
Chloride: 111 mmol/L (ref 98–111)
Creatinine, Ser: 0.84 mg/dL (ref 0.44–1.00)
GFR, Estimated: >60 mL/min (ref >60)
Glucose, Bld: 85 mg/dL (ref 70–99)
Potassium: 3.9 mmol/L (ref 3.5–5.1)
Sodium: 139 mmol/L (ref 135–145)

## 2024-04-13 LAB — GLUCOSE, CAPILLARY: Glucose-Capillary: 91 mg/dL (ref 70–99)

## 2024-04-13 NOTE — Plan of Care (Signed)

## 2024-04-13 NOTE — Plan of Care (Signed)
  Problem: Education: Goal: Knowledge of condition and prescribed therapy will improve Outcome: Progressing   Problem: Cardiac: Goal: Will achieve and/or maintain adequate cardiac output Outcome: Progressing   Problem: Physical Regulation: Goal: Complications related to the disease process, condition or treatment will be avoided or minimized Outcome: Progressing   Problem: Education: Goal: Knowledge of General Education information will improve Description: Including pain rating scale, medication(s)/side effects and non-pharmacologic comfort measures Outcome: Progressing   Problem: Clinical Measurements: Goal: Ability to maintain clinical measurements within normal limits will improve Outcome: Progressing Goal: Will remain free from infection Outcome: Progressing Goal: Diagnostic test results will improve Outcome: Progressing Goal: Respiratory complications will improve Outcome: Progressing Goal: Cardiovascular complication will be avoided Outcome: Progressing   Problem: Nutrition: Goal: Adequate nutrition will be maintained Outcome: Progressing   Problem: Coping: Goal: Level of anxiety will decrease Outcome: Progressing   Problem: Elimination: Goal: Will not experience complications related to bowel motility Outcome: Progressing Goal: Will not experience complications related to urinary retention Outcome: Progressing   Problem: Pain Managment: Goal: General experience of comfort will improve and/or be controlled Outcome: Progressing   Problem: Safety: Goal: Ability to remain free from injury will improve Outcome: Progressing   Problem: Skin Integrity: Goal: Risk for impaired skin integrity will decrease Outcome: Progressing   Problem: Health Behavior/Discharge Planning: Goal: Ability to manage health-related needs will improve Outcome: Not Progressing   Problem: Activity: Goal: Risk for activity intolerance will decrease Outcome: Not Progressing

## 2024-04-13 NOTE — Progress Notes (Signed)
 VASCULAR LAB    Carotid duplex has been performed.  See CV proc for preliminary results.   Advik Weatherspoon, RVT 04/13/2024, 2:31 PM

## 2024-04-13 NOTE — Discharge Summary (Signed)
 Physician Discharge Summary  Danielle Mccarthy. Danielle Mccarthy FMW:[id number] DOB: [birth year] DOA: 04/11/2024  PCP: Ilah Crigler, MD  Admit date: 04/11/2024 Discharge date: 04/13/2024  Admitted From: Home Disposition:  Home  Discharge Condition:Stable CODE STATUS:FULL Diet recommendation: Regular  Brief/Interim Summary: Patient is a 57 female with history of primary progressive multiple sclerosis with spastic quadriparesis, wheelchair dependence, hypertension, hard of hearing who presented with chest pain, near loss of consciousness.  Patient was sitting down eating dinner when suddenly she developed midsternal chest pain, seeing spots in her vision, felt nauseous.  Symptoms resolved after 5 minutes .she also had a staring episode.  EMS found her hypotensive.  She had similar episode about 3 weeks ago.  Patient is chronically bedbound/wheelchair dependent due to her multiple sclerosis.  Required assistance with most ADLs.  On presentation, blood pressure stable.  Other vitals were stable.  Lab works were reassuring.  Chest x-ray showed low lung volumes without focal consolidation edema or effusion.  Patient admitted for the evaluation of syncopal event.  She was given gentle efforts.  Currently she is normotensive without any medications.  Echo, carotid Doppler were normal.  Will stop antihypertensives on discharge which could be the cause of near syncopal event.  Medically stable for discharge.  Following problems were addressed during the hospitalization:  Near syncope: Presented with near syncopal episode with chest pressure.  Found to have systolic murmur.  Troponin negative.  EKG without ischemic changes.  EMS had found her hypotensive.  Could be vasovagal or hypovolemic syncope.  Started on IV fluid.  Echo showed hyperdynamic function with EF of more than 75%, no valvular abnormalities.  Carotid Doppler did not show any significant obstruction.  Likely cause of syncope was hypotension, hypovolemia.    Mild AKI: Creatinine 1.1 on admission.  Currently stable.   History of hypertension: Hypotensive on presentation.  Antihypertensives on hold.  Blood pressure stable now   Multiple sclerosis with spastic quadriplegia paresis: Follows with Dr. Pamela.  Currently not on disease modifying drugs.  Wheel Chair dependent.  Continue baclofen  as needed     Discharge Diagnoses:  Principal Problem:   Near syncope Active Problems:   Multiple sclerosis (HCC)   Hypertension    Discharge Instructions  Discharge Instructions     Diet general   Complete by: As directed    Discharge instructions   Complete by: As directed    1)Please take your medications as instructed 2)Follow up with your PCP in a week 3)We be stopped your blood pressure medication.  Your blood pressure is stable without any medication right now.  Monitor blood pressure at home.   Increase activity slowly   Complete by: As directed       Allergies as of 04/13/2024   No Known Allergies      Medication List     STOP taking these medications    lisinopril-hydrochlorothiazide 10-12.5 MG tablet Commonly known as: ZESTORETIC       TAKE these medications    baclofen  10 MG tablet Commonly known as: LIORESAL  Take 1/2 to 1 pill tid What changed:  how much to take how to take this when to take this reasons to take this additional instructions   cholecalciferol 25 MCG (1000 UNIT) tablet Commonly known as: VITAMIN D3 Take 1,000 Units by mouth daily.   Garlique 400 MG Tbec Generic drug: Garlic Take 400 mg by mouth daily.   hydrocortisone 2.5 % cream SMARTSIG:sparingly Topical Twice Daily PRN   meloxicam  7.5 MG tablet Commonly  known as: Mobic  Take 1 tablet (7.5 mg total) by mouth daily.   nystatin cream Commonly known as: MYCOSTATIN Apply topically 2 (two) times daily.   nystatin powder Commonly known as: MYCOSTATIN/NYSTOP Apply 1 Application topically 2 (two) times daily.   rosuvastatin 10 MG  tablet Commonly known as: CRESTOR Take 10 mg by mouth every morning.        Follow-up Information     Ilah Crigler, MD. Schedule an appointment as soon as possible for a visit in 1 week(s).   Specialty: Family Medicine Contact information: [id number] [place] [address] [phone]                No Known Allergies  Consultations: None   Procedures/Studies: VAS US  CAROTID Result Date: 04/13/2024 Carotid Arterial Duplex Study Patient Name:  Danielle Mccarthy  Date of Exam:   04/13/2024 Medical Rec #: [id number]                     Accession #:    [id number] Date of Birth: [birth year]                     Patient Gender: F Patient Age:   30 years Exam Location:  [facility] Procedure:      VAS US  CAROTID Referring Phys: Mikiala Fugett --------------------------------------------------------------------------------  Indications:       Near Syncope, chest pressure while eating, hypotension.                    Progressive multiple sclerosis with spastic quadriparesis and                    wheelchair dependence. Risk Factors:      Hypertension. LV outflow obstruction by echo done 04/13/2024. Limitations        Today's exam was limited due to Vessel tortuosity. Comparison Study:  No prior study on file Performing Technologist: Alberta Lis RVS  Examination Guidelines: A complete evaluation includes B-mode imaging, spectral Doppler, color Doppler, and power Doppler as needed of all accessible portions of each vessel. Bilateral testing is considered an integral part of a complete examination. Limited examinations for reoccurring indications may be performed as noted.  Right Carotid Findings: +----------+--------+--------+--------+------------------+------------------+           PSV cm/sEDV cm/sStenosisPlaque DescriptionComments           +----------+--------+--------+--------+------------------+------------------+ CCA Prox  197     23                                                    +----------+--------+--------+--------+------------------+------------------+ CCA Distal139     20                                intimal thickening +----------+--------+--------+--------+------------------+------------------+ ICA Prox  71      16              heterogenous      tortuous           +----------+--------+--------+--------+------------------+------------------+ ICA Mid   143     38  tortuous           +----------+--------+--------+--------+------------------+------------------+ ICA Distal143     43                                tortuous           +----------+--------+--------+--------+------------------+------------------+ ECA       175     12                                                   +----------+--------+--------+--------+------------------+------------------+ +----------+--------+-------+--------+-------------------+           PSV cm/sEDV cmsDescribeArm Pressure (mmHG) +----------+--------+-------+--------+-------------------+ Subclavian150                                        +----------+--------+-------+--------+-------------------+ +---------+--------+--+--------+--+ VertebralPSV cm/s78EDV cm/s13 +---------+--------+--+--------+--+  Left Carotid Findings: +----------+--------+--------+--------+------------------+--------+           PSV cm/sEDV cm/sStenosisPlaque DescriptionComments +----------+--------+--------+--------+------------------+--------+ CCA Prox  125     16                                tortuous +----------+--------+--------+--------+------------------+--------+ CCA Distal168     19                                         +----------+--------+--------+--------+------------------+--------+ ICA Prox  106     34              heterogenous      tortuous +----------+--------+--------+--------+------------------+--------+ ICA Mid   86       23                                tortuous +----------+--------+--------+--------+------------------+--------+ ICA Distal168     46                                tortuous +----------+--------+--------+--------+------------------+--------+ ECA       181     17                                         +----------+--------+--------+--------+------------------+--------+ +----------+--------+--------+--------+-------------------+           PSV cm/sEDV cm/sDescribeArm Pressure (mmHG) +----------+--------+--------+--------+-------------------+ [id number]                                         +----------+--------+--------+--------+-------------------+ +---------+--------+---+--------+--+ VertebralPSV cm/s130EDV cm/s12 +---------+--------+---+--------+--+   Summary: Right Carotid: The extracranial vessels were near-normal with only minimal wall                thickening or plaque. Left Carotid: The extracranial vessels were near-normal with only minimal wall               thickening or plaque. Vertebrals:  Bilateral vertebral arteries demonstrate antegrade flow.  Subclavians: Normal flow hemodynamics were seen in bilateral subclavian              arteries. *See table(s) above for measurements and observations.     Preliminary    ECHOCARDIOGRAM COMPLETE Result Date: 04/12/2024    ECHOCARDIOGRAM REPORT   Patient Name:   Danielle Mccarthy Date of Exam: 04/12/2024 Medical Rec #:  [id number]                    Height:       61.0 in Accession #:    [id number]                   Weight:       145.5 lb Date of Birth:  1950/08/04                    BSA:          1.650 m Patient Age:    73 years                     BP:           128/53 mmHg Patient Gender: F                            HR:           80 bpm. Exam Location:  Inpatient Procedure: 2D Echo, Cardiac Doppler, Color Doppler and Intracardiac            Opacification Agent (Both Spectral and Color Flow Doppler were             utilized during procedure). Indications:    Syncope R55  History:        Patient has prior history of Echocardiogram examinations, most                 recent 06/25/2020. Risk Factors:Hypertension. Multiple sclerosis                 (HCC).  Sonographer:    Aida Pizza RCS Referring Phys: [id number] VISHAL R PATEL  Sonographer Comments: Technically difficult study due to poor echo windows. IMPRESSIONS  1. Dynamic LV outflow tract obstruction: peak gradient 68 mmHg, Vmax 4.1 m/s. Left ventricular ejection fraction, by estimation, is >75%. The left ventricle has hyperdynamic function. The left ventricle has no regional wall motion abnormalities. There is mild asymmetric left ventricular hypertrophy of the basal-septal segment. Left ventricular diastolic parameters are consistent with Grade I diastolic dysfunction (impaired relaxation).  2. Right ventricular systolic function is normal. The right ventricular size is normal. There is mildly elevated pulmonary artery systolic pressure. The estimated right ventricular systolic pressure is 38.3 mmHg.  3. The mitral valve is normal in structure. No evidence of mitral valve regurgitation. No evidence of mitral stenosis.  4. Vmax is outflow tract obstruction not aortic stenosis. The aortic valve is tricuspid. Aortic valve regurgitation is not visualized. No aortic stenosis is present. Aortic valve Vmax measures 4.11 m/s.  5. The inferior vena cava is normal in size with greater than 50% respiratory variability, suggesting right atrial pressure of 3 mmHg. FINDINGS  Left Ventricle: Dynamic LV outflow tract obstruction: peak gradient 68 mmHg, Vmax 4.1 m/s. Left ventricular ejection fraction, by estimation, is >75%. The left ventricle has hyperdynamic function. The left ventricle has no regional wall motion abnormalities. Definity contrast agent was given IV to delineate the left  ventricular endocardial borders. The left ventricular internal cavity size was normal in size. There is  mild asymmetric left ventricular hypertrophy of the basal-septal segment. Left ventricular diastolic parameters are consistent with Grade I diastolic dysfunction (impaired relaxation). Right Ventricle: The right ventricular size is normal. No increase in right ventricular wall thickness. Right ventricular systolic function is normal. There is mildly elevated pulmonary artery systolic pressure. The tricuspid regurgitant velocity is 2.97  m/s, and with an assumed right atrial pressure of 3 mmHg, the estimated right ventricular systolic pressure is 38.3 mmHg. Left Atrium: Left atrial size was normal in size. Right Atrium: Right atrial size was normal in size. Pericardium: There is no evidence of pericardial effusion. Mitral Valve: The mitral valve is normal in structure. No evidence of mitral valve regurgitation. No evidence of mitral valve stenosis. Tricuspid Valve: The tricuspid valve is normal in structure. Tricuspid valve regurgitation is mild . No evidence of tricuspid stenosis. Aortic Valve: Vmax is outflow tract obstruction not aortic stenosis. The aortic valve is tricuspid. Aortic valve regurgitation is not visualized. No aortic stenosis is present. Aortic valve peak gradient measures 67.4 mmHg. Pulmonic Valve: The pulmonic valve was normal in structure. Pulmonic valve regurgitation is trivial. No evidence of pulmonic stenosis. Aorta: The aortic root is normal in size and structure. Venous: The inferior vena cava is normal in size with greater than 50% respiratory variability, suggesting right atrial pressure of 3 mmHg. IAS/Shunts: No atrial level shunt detected by color flow Doppler.  LEFT VENTRICLE PLAX 2D LVIDd:         3.50 cm   Diastology LVIDs:         2.30 cm   LV e' medial:    9.03 cm/s LV PW:         0.90 cm   LV E/e' medial:  8.3 LV IVS:        1.20 cm   LV e' lateral:   10.60 cm/s LVOT diam:     1.80 cm   LV E/e' lateral: 7.0 LVOT Area:     2.54 cm  RIGHT VENTRICLE RV S prime:     22.10 cm/s TAPSE  (M-mode): 2.2 cm LEFT ATRIUM             Index        RIGHT ATRIUM           Index LA diam:        2.60 cm 1.58 cm/m   RA Area:     14.90 cm LA Vol (A2C):   25.9 ml 15.70 ml/m  RA Volume:   36.60 ml  22.18 ml/m LA Vol (A4C):   24.1 ml 14.61 ml/m LA Biplane Vol: 25.4 ml 15.39 ml/m  AORTIC VALVE AV Vmax:      410.50 cm/s AV Peak Grad: 67.4 mmHg  AORTA Ao Root diam: 3.00 cm MITRAL VALVE                TRICUSPID VALVE MV Area (PHT): 3.17 cm     TR Peak grad:   35.3 mmHg MV Decel Time: 239 msec     TR Vmax:        297.00 cm/s MV E velocity: 74.50 cm/s MV A velocity: 121.00 cm/s  SHUNTS MV E/A ratio:  0.62         Systemic Diam: 1.80 cm Oneil Parchment MD Electronically signed by Oneil Parchment MD Signature Date/Time: 04/12/2024/2:44:13 PM    Final    DG Chest Portable 1 View Result  Date: 04/11/2024 CLINICAL DATA:  Near syncope. EXAM: PORTABLE CHEST 1 VIEW COMPARISON:  June 25, 2020 FINDINGS: The heart size and mediastinal contours are within normal limits. Low lung volumes are noted. No acute infiltrate, pleural effusion or pneumothorax is identified. Multilevel degenerative changes are present throughout the thoracic spine. IMPRESSION: Low lung volumes without acute or active cardiopulmonary disease. Electronically Signed   By: Suzen Dials M.D.   On: 04/11/2024 22:19      Subjective: Patient seen and examined the bedside today.  Hemodynamically stable without any complaints.  Blood pressure better today.  I discussed the discharge planning with the son on phone.  Medically stable for discharge  Discharge Exam: Vitals:   04/13/24 0501 04/13/24 0823  BP: 122/62 127/71  Pulse: 69 74  Resp: 16 18  Temp: 98 F (36.7 C) 98.4 F (36.9 C)  SpO2: 100% 100%   Vitals:   04/13/24 0026 04/13/24 0500 04/13/24 0501 04/13/24 0823  BP: (!) 127/59  122/62 127/71  Pulse: 67  69 74  Resp: 16  16 18   Temp: 98.1 F (36.7 C)  98 F (36.7 C) 98.4 F (36.9 C)  TempSrc: Oral  Oral Oral  SpO2: 100%  100%  100%  Weight:  66 kg    Height:        General: Pt is alert, awake, not in acute distress Cardiovascular: RRR, S1/S2 +, no rubs, no gallops Respiratory: CTA bilaterally, no wheezing, no rhonchi Abdominal: Soft, NT, ND, bowel sounds + Extremities: no edema, no cyanosis, quadriparesis    The results of significant diagnostics from this hospitalization (including imaging, microbiology, ancillary and laboratory) are listed below for reference.     Microbiology: No results found for this or any previous visit (from the past 240 hours).   Labs: BNP (last 3 results) Recent Labs    04/11/24 2117  BNP 31.5   Basic Metabolic Panel: Recent Labs  Lab 04/11/24 2117 04/12/24 0709 04/13/24 0431  NA 138 140 139  K 4.0 4.4 3.9  CL 104 104 111  CO2 25 26 25   GLUCOSE 130* 101* 85  BUN 15 18 12   CREATININE 1.13* 1.11* 0.84  CALCIUM 8.6* 9.0 8.2*   Liver Function Tests: Recent Labs  Lab 04/11/24 2117  AST 25  ALT 11  ALKPHOS 61  BILITOT 0.7  PROT 6.3*  ALBUMIN 3.0*   No results for input(s): LIPASE, AMYLASE in the last 168 hours. No results for input(s): AMMONIA in the last 168 hours. CBC: Recent Labs  Lab 04/11/24 2117 04/12/24 0110 04/13/24 0431  WBC 5.9 2.5* 6.2  HGB 12.0 11.9* 10.8*  HCT 38.8 37.1 34.3*  MCV 84.7 83.6 83.7  PLT 198 128* 222   Cardiac Enzymes: No results for input(s): CKTOTAL, CKMB, CKMBINDEX, TROPONINI in the last 168 hours. BNP: Invalid input(s): POCBNP CBG: Recent Labs  Lab 04/11/24 2044 04/12/24 0544 04/13/24 0553  GLUCAP 136* 96 91   D-Dimer No results for input(s): DDIMER in the last 72 hours. Hgb A1c No results for input(s): HGBA1C in the last 72 hours. Lipid Profile No results for input(s): CHOL, HDL, LDLCALC, TRIG, CHOLHDL, LDLDIRECT in the last 72 hours. Thyroid  function studies No results for input(s): TSH, T4TOTAL, T3FREE, THYROIDAB in the last 72 hours.  Invalid input(s):  FREET3 Anemia work up No results for input(s): VITAMINB12, FOLATE, FERRITIN, TIBC, IRON, RETICCTPCT in the last 72 hours. Urinalysis    Component Value Date/Time   COLORURINE YELLOW 04/12/2024 0114   APPEARANCEUR HAZY (  A) 04/12/2024 0114   LABSPEC 1.014 04/12/2024 0114   PHURINE 7.0 04/12/2024 0114   GLUCOSEU NEGATIVE 04/12/2024 0114   HGBUR NEGATIVE 04/12/2024 0114   BILIRUBINUR NEGATIVE 04/12/2024 0114   KETONESUR NEGATIVE 04/12/2024 0114   PROTEINUR NEGATIVE 04/12/2024 0114   UROBILINOGEN 0.2 06/26/2011 2145   NITRITE POSITIVE (A) 04/12/2024 0114   LEUKOCYTESUR SMALL (A) 04/12/2024 0114   Sepsis Labs Recent Labs  Lab 04/11/24 2117 04/12/24 0110 04/13/24 0431  WBC 5.9 2.5* 6.2   Microbiology No results found for this or any previous visit (from the past 240 hours).  Please note: You were cared for by a hospitalist during your hospital stay. Once you are discharged, your primary care physician will handle any further medical issues. Please note that NO REFILLS for any discharge medications will be authorized once you are discharged, as it is imperative that you return to your primary care physician (or establish a relationship with a primary care physician if you do not have one) for your post hospital discharge needs so that they can reassess your need for medications and monitor your lab values.    Time coordinating discharge: 40 minutes  SIGNED:   Ivonne Mustache, MD  [facility] 04/13/2024, 2:47 PM Pager [phone]  If 7PM-7AM, please contact night-coverage [email or web address] Password [email or web address]

## 2024-04-28 ENCOUNTER — Other Ambulatory Visit: Payer: Self-pay | Admitting: Student

## 2024-04-28 DIAGNOSIS — Z1231 Encounter for screening mammogram for malignant neoplasm of breast: Secondary | ICD-10-CM

## 2024-05-27 ENCOUNTER — Ambulatory Visit: Payer: Medicare Other | Admitting: Neurology

## 2024-06-02 ENCOUNTER — Ambulatory Visit
Admission: RE | Admit: 2024-06-02 | Discharge: 2024-06-02 | Disposition: A | Discharge status: Home / Self Care | Source: Ambulatory Visit | Attending: Student | Admitting: Student

## 2024-06-02 DIAGNOSIS — Z1231 Encounter for screening mammogram for malignant neoplasm of breast: Secondary | ICD-10-CM

## 2024-07-22 ENCOUNTER — Encounter: Admitting: Obstetrics and Gynecology

## 2025-02-12 ENCOUNTER — Ambulatory Visit: Admitting: Neurology
